# Patient Record
Sex: Female | Born: 1970 | Race: Black or African American | Marital: Married | Smoking: Former smoker
Health system: Southern US, Community
[De-identification: ages and names within clinical notes are randomized; demographics above are authoritative.]

## PROBLEM LIST (undated history)

## (undated) DIAGNOSIS — E559 Vitamin D deficiency, unspecified: Secondary | ICD-10-CM

## (undated) DIAGNOSIS — R7303 Prediabetes: Secondary | ICD-10-CM

## (undated) HISTORY — PX: TONSILLECTOMY: SUR1361

## (undated) HISTORY — DX: Vitamin D deficiency, unspecified: E55.9

## (undated) HISTORY — DX: Prediabetes: R73.03

---

## 2019-08-14 DIAGNOSIS — B353 Tinea pedis: Secondary | ICD-10-CM | POA: Insufficient documentation

## 2019-08-29 DIAGNOSIS — M79644 Pain in right finger(s): Secondary | ICD-10-CM | POA: Insufficient documentation

## 2019-10-21 DIAGNOSIS — R269 Unspecified abnormalities of gait and mobility: Secondary | ICD-10-CM | POA: Insufficient documentation

## 2019-10-21 DIAGNOSIS — G8929 Other chronic pain: Secondary | ICD-10-CM | POA: Insufficient documentation

## 2019-10-21 DIAGNOSIS — R1031 Right lower quadrant pain: Secondary | ICD-10-CM | POA: Insufficient documentation

## 2019-10-21 DIAGNOSIS — M792 Neuralgia and neuritis, unspecified: Secondary | ICD-10-CM | POA: Insufficient documentation

## 2020-03-04 ENCOUNTER — Other Ambulatory Visit: Payer: Self-pay | Admitting: Internal Medicine

## 2020-03-04 ENCOUNTER — Ambulatory Visit
Admission: RE | Admit: 2020-03-04 | Discharge: 2020-03-04 | Disposition: A | Discharge status: Home / Self Care | Payer: BLUE CROSS/BLUE SHIELD | Source: Ambulatory Visit | Attending: Internal Medicine | Admitting: Internal Medicine

## 2020-03-04 ENCOUNTER — Other Ambulatory Visit: Payer: Self-pay

## 2020-03-04 DIAGNOSIS — M25551 Pain in right hip: Secondary | ICD-10-CM

## 2020-03-04 DIAGNOSIS — R202 Paresthesia of skin: Secondary | ICD-10-CM | POA: Diagnosis not present

## 2020-03-04 DIAGNOSIS — M4186 Other forms of scoliosis, lumbar region: Secondary | ICD-10-CM | POA: Diagnosis not present

## 2020-03-04 DIAGNOSIS — M4316 Spondylolisthesis, lumbar region: Secondary | ICD-10-CM | POA: Diagnosis not present

## 2020-03-04 DIAGNOSIS — M5136 Other intervertebral disc degeneration, lumbar region: Secondary | ICD-10-CM | POA: Diagnosis not present

## 2020-03-04 DIAGNOSIS — E042 Nontoxic multinodular goiter: Secondary | ICD-10-CM | POA: Diagnosis not present

## 2020-03-04 DIAGNOSIS — R7303 Prediabetes: Secondary | ICD-10-CM | POA: Diagnosis not present

## 2020-03-04 DIAGNOSIS — R768 Other specified abnormal immunological findings in serum: Secondary | ICD-10-CM | POA: Diagnosis not present

## 2020-03-04 DIAGNOSIS — M1611 Unilateral primary osteoarthritis, right hip: Secondary | ICD-10-CM | POA: Diagnosis not present

## 2020-03-04 DIAGNOSIS — I878 Other specified disorders of veins: Secondary | ICD-10-CM | POA: Diagnosis not present

## 2020-03-19 ENCOUNTER — Other Ambulatory Visit: Payer: Self-pay | Admitting: Internal Medicine

## 2020-03-19 DIAGNOSIS — Z1231 Encounter for screening mammogram for malignant neoplasm of breast: Secondary | ICD-10-CM

## 2020-04-02 ENCOUNTER — Ambulatory Visit
Admission: RE | Admit: 2020-04-02 | Discharge: 2020-04-02 | Disposition: A | Discharge status: Home / Self Care | Payer: BLUE CROSS/BLUE SHIELD | Source: Ambulatory Visit | Attending: Internal Medicine | Admitting: Internal Medicine

## 2020-04-02 ENCOUNTER — Other Ambulatory Visit: Payer: Self-pay

## 2020-04-02 DIAGNOSIS — Z1231 Encounter for screening mammogram for malignant neoplasm of breast: Secondary | ICD-10-CM

## 2020-05-07 ENCOUNTER — Ambulatory Visit: Payer: BLUE CROSS/BLUE SHIELD | Admitting: Obstetrics and Gynecology

## 2021-07-18 DIAGNOSIS — R7303 Prediabetes: Secondary | ICD-10-CM | POA: Diagnosis not present

## 2021-07-18 DIAGNOSIS — Z0000 Encounter for general adult medical examination without abnormal findings: Secondary | ICD-10-CM | POA: Diagnosis not present

## 2021-07-18 DIAGNOSIS — E559 Vitamin D deficiency, unspecified: Secondary | ICD-10-CM | POA: Diagnosis not present

## 2021-07-26 ENCOUNTER — Other Ambulatory Visit: Payer: Self-pay | Admitting: Internal Medicine

## 2021-07-26 DIAGNOSIS — Z1231 Encounter for screening mammogram for malignant neoplasm of breast: Secondary | ICD-10-CM

## 2021-07-27 ENCOUNTER — Other Ambulatory Visit: Payer: Self-pay

## 2021-07-27 ENCOUNTER — Ambulatory Visit
Admission: RE | Admit: 2021-07-27 | Discharge: 2021-07-27 | Disposition: A | Discharge status: Home / Self Care | Payer: BC Managed Care – PPO | Source: Ambulatory Visit | Attending: Internal Medicine | Admitting: Internal Medicine

## 2021-07-27 DIAGNOSIS — Z1231 Encounter for screening mammogram for malignant neoplasm of breast: Secondary | ICD-10-CM | POA: Diagnosis not present

## 2021-08-23 ENCOUNTER — Ambulatory Visit
Admission: RE | Admit: 2021-08-23 | Discharge: 2021-08-23 | Disposition: A | Discharge status: Home / Self Care | Payer: BC Managed Care – PPO | Source: Ambulatory Visit | Attending: Internal Medicine | Admitting: Internal Medicine

## 2021-08-23 ENCOUNTER — Other Ambulatory Visit: Payer: Self-pay | Admitting: Internal Medicine

## 2021-08-23 DIAGNOSIS — M545 Low back pain, unspecified: Secondary | ICD-10-CM | POA: Diagnosis not present

## 2021-08-23 DIAGNOSIS — M25551 Pain in right hip: Secondary | ICD-10-CM | POA: Diagnosis not present

## 2021-08-23 DIAGNOSIS — E663 Overweight: Secondary | ICD-10-CM | POA: Diagnosis not present

## 2021-09-13 IMAGING — CR DG LUMBAR SPINE 2-3V
3 series · 3 of 3 positions shown · non-contrast
Comparison: None.

CLINICAL DATA: Right hip pain and right leg weakness.

EXAM:
LUMBAR SPINE - 2-3 VIEW

[w lumbar spine ap]
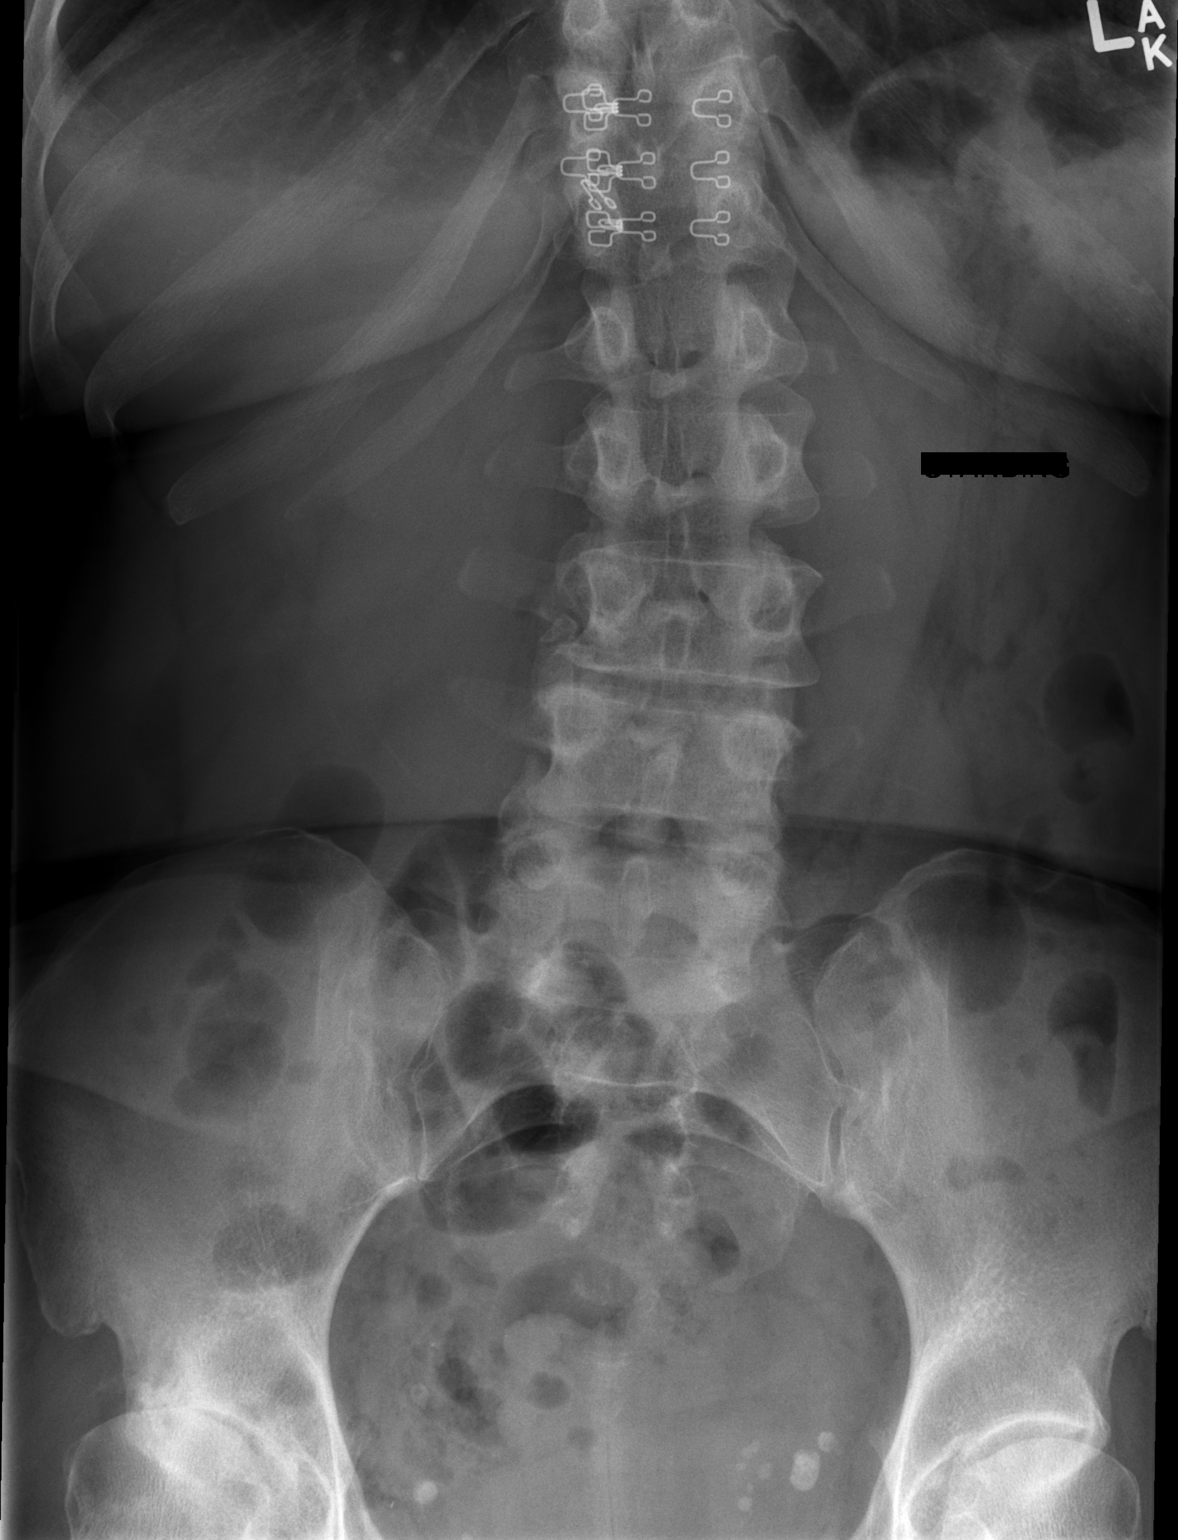

[w lumbar spine lat]
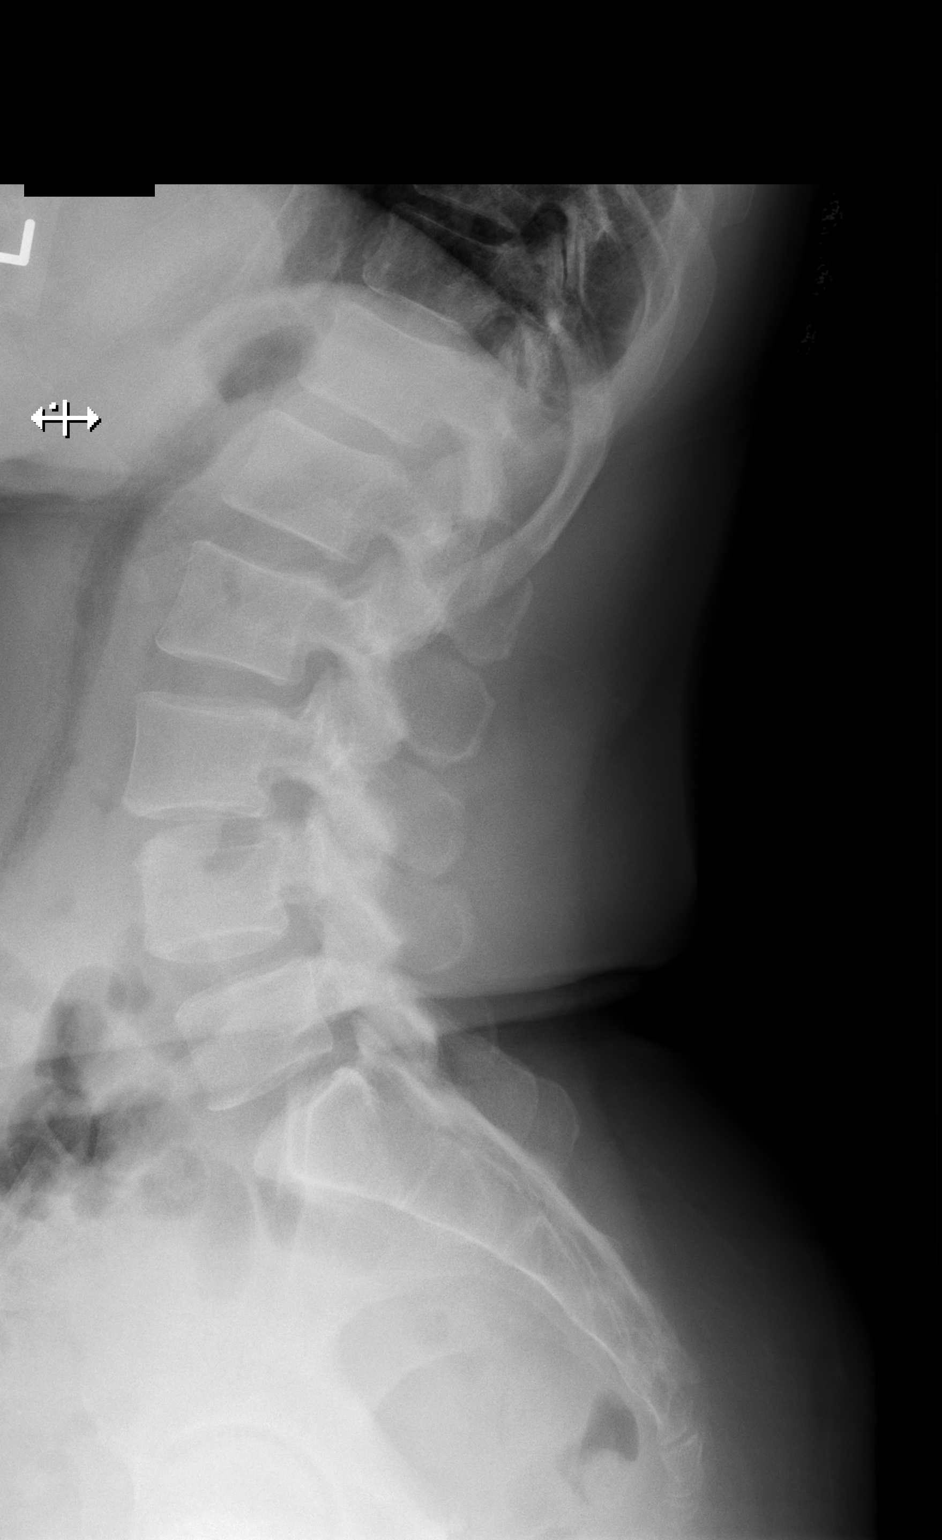

[w lumbar l-5 s-1 spot]
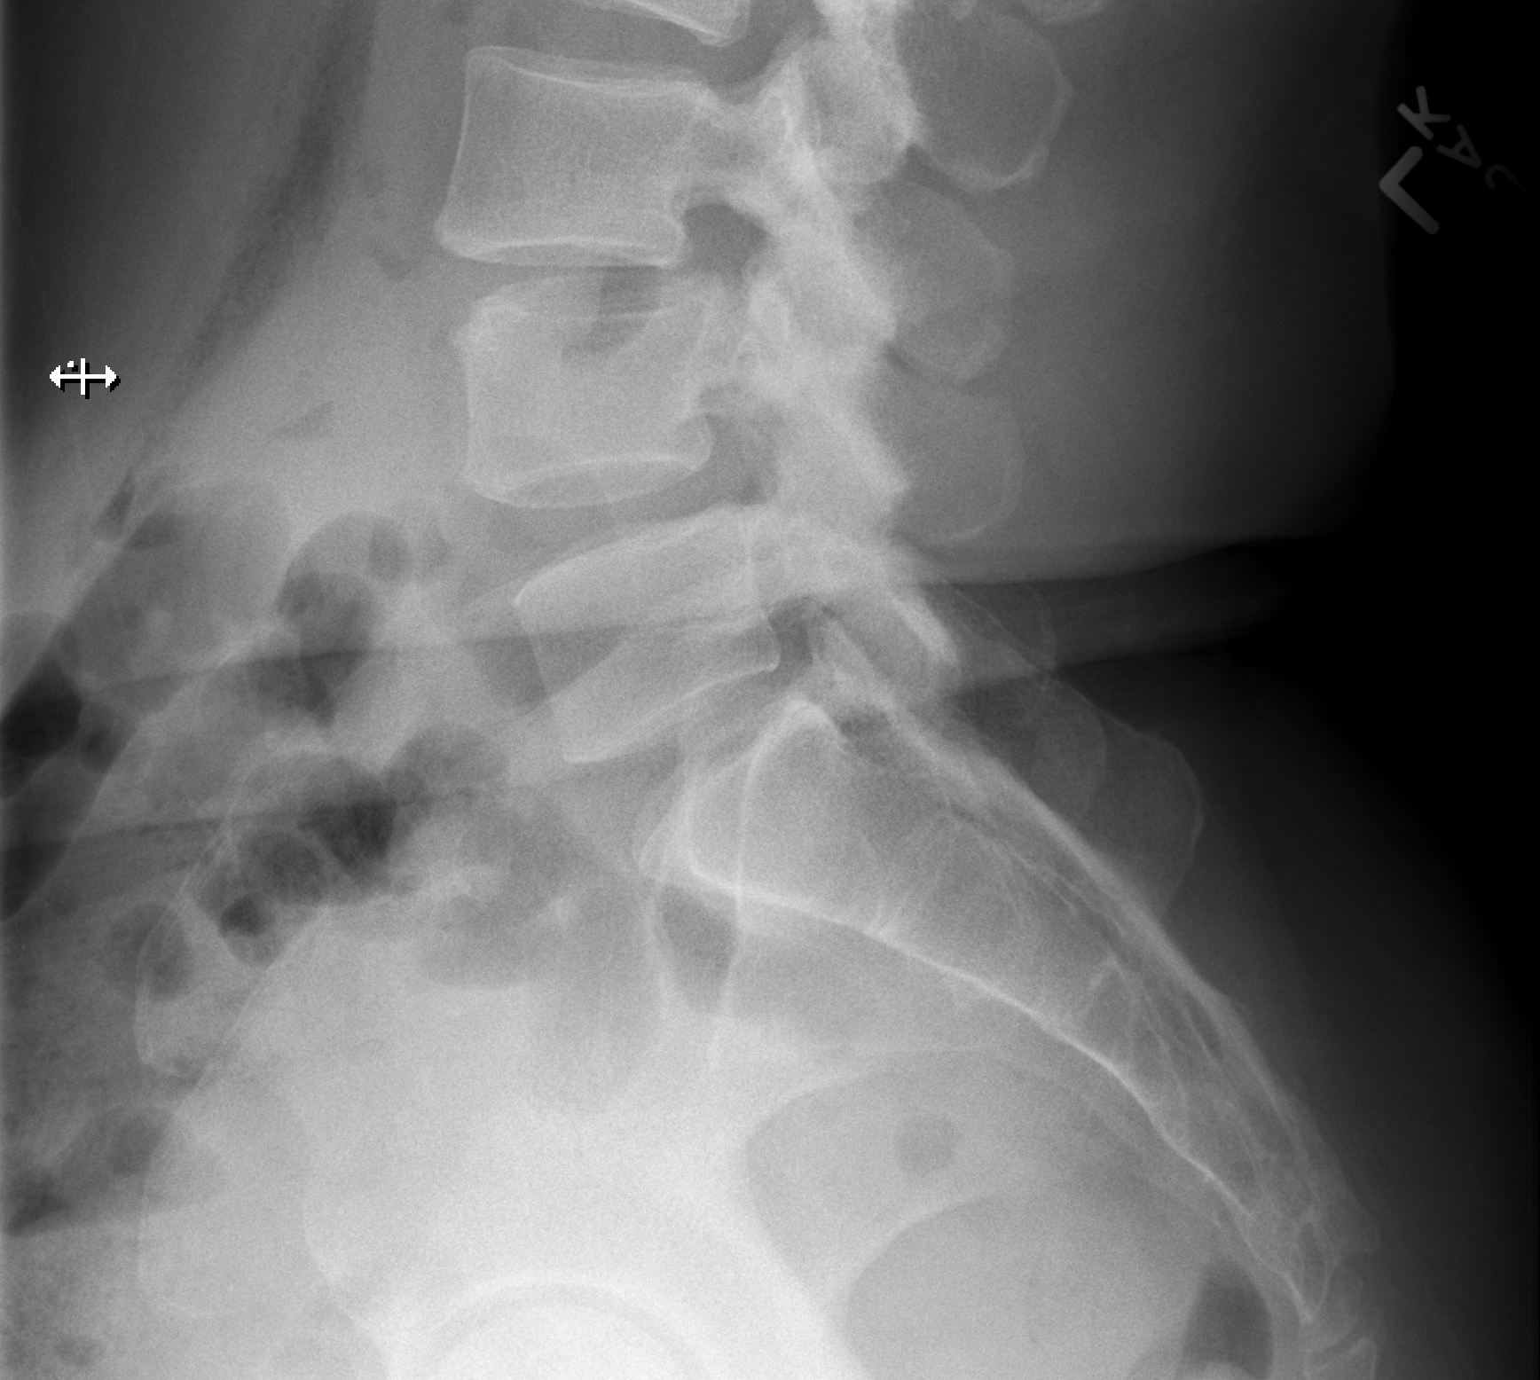

[3 of 3 positions shown; findings below may reference images not displayed]

FINDINGS: There is no evidence of lumbar spine fracture. There is mild
levoscoliosis. Approximately 2 mm anterolisthesis of the L3
vertebral body is seen on L4. Mild to moderate severity
intervertebral disc space narrowing is noted at the level of L3-L4.
IMPRESSION: 1. Mild levoscoliosis.
2. Mild to moderate degenerative disc disease at L3-L4.
3. Grade 1 anterolisthesis of the L3 vertebral body.

## 2021-10-24 DIAGNOSIS — M545 Low back pain, unspecified: Secondary | ICD-10-CM | POA: Diagnosis not present

## 2021-10-24 DIAGNOSIS — M25551 Pain in right hip: Secondary | ICD-10-CM | POA: Diagnosis not present

## 2021-10-27 ENCOUNTER — Encounter: Payer: Self-pay | Admitting: Family Medicine

## 2021-10-27 ENCOUNTER — Ambulatory Visit: Payer: BC Managed Care – PPO | Admitting: Family Medicine

## 2021-10-27 VITALS — BP 116/68 | HR 64 | Temp 97.8°F | Ht 64.0 in | Wt 157.0 lb

## 2021-10-27 DIAGNOSIS — Z01818 Encounter for other preprocedural examination: Secondary | ICD-10-CM

## 2021-10-27 NOTE — Patient Instructions (Signed)
Daisey Must with your surgery.  ?I will see you back as needed.  ?

## 2021-10-27 NOTE — Progress Notes (Signed)
? ?  Subjective:  ? ? Patient ID: Terri Parsons, female    DOB: 1971-07-21, 51 y.o.   MRN: [id number] ? ?HPI ?Chief Complaint  ?Patient presents with  ? Establish Care  ?  Orthopedic doctor (Dr. Edmonia Lynch at [facility]) needs surgical clearance   ? ?She is new to the practice and here for surgical clearance.  ?Previous medical care: ? ?Other providers: ?Orthopedist  ? ?States she is having surgery, Right total hip replacement. Date is TBD.  ?Dr. Percell Miller will be doing her surgery.  ?She has a form for me to sign off on today.  ? ?Denies hx of surgical complications. She had tonsillectomy in past.  ? ?History of prediabetes  ?Hgb A1c 5.2% in 08/2019 ? ?Denies hx of cardiac, pulmonary, thyroid, liver, or renal disease.  ? ?Denies fever, chills, unexplained weight loss, fatigue, dizziness, headache, vision change, chest pain, palpitations, shortness of breath, cough, orthopnea,  abdominal pain, N/V/D, urinary symptoms, LE edema.  ? ? ?Social history: Lives with spouse, no children, works as Retail buyer from 3p-11p. Also works at [employer] during the day.  ? ? ? ?Reviewed allergies, medications, past medical, surgical, family, and social history. ? ? ? ?Review of Systems ?Pertinent positives and negatives in the history of present illness. ? ?   ?Objective:  ? Physical Exam ?BP 116/68 (BP Location: Left Arm, Patient Position: Sitting, Cuff Size: Large)   Pulse 64   Temp 97.8 ?F (36.6 ?C) (Temporal)   Ht 5\' 4"  (1.626 m)   Wt 157 lb (71.2 kg)   LMP 10/12/2021   SpO2 98%   BMI 26.95 kg/m?  ? ?Alert and in no distress. Pharyngeal area is normal. Neck is supple without adenopathy or thyromegaly. Cardiac exam shows a regular rhythm without murmurs or gallops. Lungs are clear to auscultation. Extremities without edema. Skin is warm and dry. CNs intact. PERLLA. Normal mood and behavior.  ? ? ? ?   ?Assessment & Plan:  ?Preoperative clearance - Plan: EKG 12-Lead ? ?EKG read by myself and Dr. Sharlet Salina. No acute  changes. No old EKG for comparison.  ?She is in her usual state of health and no concerns today.  ?She would like surgical clearance to have a right total hip replacement by Dr. Percell Miller.  ?Labs will be done by orthopedist per pre-op form.  ?I see no reason based on my exam today for her to avoid surgery. I wish her a successful surgery and she will follow up for a fasting CPE.  ?Surgical clearance faxed along with EKG ?

## 2021-11-16 ENCOUNTER — Encounter: Payer: Self-pay | Admitting: Family Medicine

## 2021-11-16 ENCOUNTER — Ambulatory Visit (INDEPENDENT_AMBULATORY_CARE_PROVIDER_SITE_OTHER): Payer: BC Managed Care – PPO | Admitting: Family Medicine

## 2021-11-16 VITALS — BP 106/70 | HR 90 | Temp 98.0°F | Ht 64.0 in | Wt 153.8 lb

## 2021-11-16 DIAGNOSIS — Z1211 Encounter for screening for malignant neoplasm of colon: Secondary | ICD-10-CM | POA: Diagnosis not present

## 2021-11-16 DIAGNOSIS — Z0000 Encounter for general adult medical examination without abnormal findings: Secondary | ICD-10-CM

## 2021-11-16 DIAGNOSIS — Z87898 Personal history of other specified conditions: Secondary | ICD-10-CM | POA: Diagnosis not present

## 2021-11-16 DIAGNOSIS — Z23 Encounter for immunization: Secondary | ICD-10-CM | POA: Diagnosis not present

## 2021-11-16 DIAGNOSIS — Z114 Encounter for screening for human immunodeficiency virus [HIV]: Secondary | ICD-10-CM

## 2021-11-16 DIAGNOSIS — Z1329 Encounter for screening for other suspected endocrine disorder: Secondary | ICD-10-CM

## 2021-11-16 DIAGNOSIS — Z1159 Encounter for screening for other viral diseases: Secondary | ICD-10-CM

## 2021-11-16 DIAGNOSIS — E559 Vitamin D deficiency, unspecified: Secondary | ICD-10-CM | POA: Diagnosis not present

## 2021-11-16 NOTE — Progress Notes (Signed)
? ?Subjective:  ? ? Patient ID: Terri Parsons, female    DOB: March 17, 1971, 51 y.o.   MRN: [id number] ? ?HPI ?Chief Complaint  ?Patient presents with  ? Annual Exam  ? ?She is fairly new to the practice and here for a complete physical exam. ? ? ?Other providers: ?Orthopedic- Dr. Eulah Pont  ? ?Hx of prediabetes and vitamin D def ? 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 ? 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  ? ?Immunizations: Tdap overdue and Shingles ? ?Health maintenance:   ?Mammogram: 06/2021 ?Colonoscopy: never  ?Last Gynecological Exam: 2 years ago and HPV + per patient  ? ? ? ?Reviewed allergies, medications, past medical, surgical, family, and social history. ? ? ? ?Review of Systems ?Review of Systems ?Constitutional: -fever, -chills, -sweats, -unexpected weight change,-fatigue ?ENT: -runny nose, -ear pain, -sore throat ?Cardiology:  -chest pain, -palpitations, -edema ?Respiratory: -cough, -shortness of breath, -wheezing ?Gastroenterology: -abdominal pain, -nausea, -vomiting, -diarrhea, -constipation  ?Hematology: -bleeding or bruising problems ?Musculoskeletal: -arthralgias, -myalgias, -joint swelling, -back pain ?Ophthalmology: -vision changes ?Urology: -dysuria, -difficulty urinating, -hematuria, -urinary frequency, -urgency ?Neurology: -headache, -weakness, -tingling, -numbness ?  ? ?   ?Objective:  ? Physical Exam ?BP 106/70 (BP Location: Left Arm, Patient Position: Sitting, Cuff Size: Large)   Pulse 90   Temp 98 ?F (36.7 ?C) (Temporal)   Ht 5\' 4"  (1.626 m)   Wt 153 lb 12.8 oz (69.8 kg)   LMP 10/27/2021   SpO2 98%   BMI 26.40 kg/m?  ? ?General Appearance:    Alert, cooperative, no distress, appears stated age  ?Head:    Normocephalic, without obvious abnormality, atraumatic  ?Eyes:    PERRL, conjunctiva/corneas clear, EOM's intact  ?Ears:    Normal TM's and external  ear canals  ?Nose: Not done  ?Throat: Not done  ?Neck:   Supple, no lymphadenopathy;  thyroid:  no   enlargement/tenderness/nodules; no JVD  ?Back:    Spine nontender, no curvature, ROM normal, no CVA     tenderness  ?Lungs:     Clear to auscultation bilaterally without wheezes, rales or     ronchi; respirations unlabored  ?Chest Wall:    No tenderness or deformity  ? Heart:    Regular rate and rhythm, S1 and S2 normal, no murmur, rub ?  or gallop  ?Breast Exam:  OB/GYN  ?Abdomen:     Soft, non-tender, nondistended, normoactive bowel sounds,  ?  no masses, no hepatosplenomegaly  ?Genitalia:  OB/GYN  ?   ?Extremities:   No clubbing, cyanosis or edema  ?Pulses:   2+ and symmetric all extremities  ?Skin:   Skin color, texture, turgor normal  ?Lymph nodes:   Cervical, supraclavicular, and axillary nodes normal  ?Neurologic:   CNII-XII intact, normal strength, sensation and gait; reflexes 2+ and symmetric throughout  ?        Psych:   Normal mood, affect, hygiene and grooming.   ? ? ? ?   ?Assessment & Plan:  ?Routine general medical examination at a health care facility - Plan: CBC with Differential/Platelet, Comprehensive metabolic panel, Lipid panel, TSH, T4, free ?-Preventive health care reviewed.  She will call and schedule with an OB/GYN.  Mammogram will be due again in December and she will call the breast center for this.  Counseling on healthy lifestyle including diet and exercise.  Immunizations reviewed and updated.  Recommend regular dental and eye exams. ? ?Vitamin D deficiency - Plan: VITAMIN D 25 Hydroxy (Vit-D Deficiency, Fractures) ?-Follow-up pending vitamin D level ? ?Screen for colon cancer - Plan: Ambulatory referral to Gastroenterology ?-Referred to GI for her for screening colonoscopy. ? ?History of prediabetes - Plan: Hemoglobin A1c ?-Recommend low sugar, low-carb diet ? ?Need for hepatitis C screening test - Plan: Hepatitis C antibody ?-Done per screening guidelines ? ?Need for  diphtheria-tetanus-pertussis (Tdap) vaccine - Plan: Tdap vaccine greater than or equal to 7yo IM ?-Discussed potential side effects ? ?Screening for HIV without presence of risk factors - Plan: HIV Antibody (routine testing w rflx) ?-Done per screening guidelines ? ?Screening for thyroid disorder ? ? ?

## 2021-11-16 NOTE — Patient Instructions (Addendum)
You are eligible to get the shingles vaccine since you are over 51 years old. This is a 2 shot series. Check with your insurance regarding your financial responsibility with this.  ? ? ?Call and schedule with a gynecologist.  ? ?Obgyn Offices:  ? ?[facility] ?[address] ?[address] ?[place], [place] ?[phone] ? ?[facility] ?Address: [address] ?[place], [place] [address] ?Phone: [phone] ? ?[facility] ?[address] ?[address] ?[place], [place] [address] ?Phone: [phone] ? ?[facility] ?[address] ?[place], [place] [address] ?Phone: [phone] ? ?Preventive Care 5-65 Years Old, Female ?Preventive care refers to lifestyle choices and visits with your health care provider that can promote health and wellness. Preventive care visits are also called wellness exams. ?What can I expect for my preventive care visit? ?Counseling ?Your health care provider may ask you questions about your: ?Medical history, including: ?Past medical problems. ?Family medical history. ?Pregnancy history. ?Current health, including: ?Menstrual cycle. ?Method of birth control. ?Emotional well-being. ?Home life and relationship well-being. ?Sexual activity and sexual health. ?Lifestyle, including: ?Alcohol, nicotine or tobacco, and drug use. ?Access to firearms. ?Diet, exercise, and sleep habits. ?Work and work Statistician. ?Sunscreen use. ?Safety issues such as seatbelt and bike helmet use. ?Physical exam ?Your health care provider will check your: ?Height and weight. These may be used to calculate your BMI (body mass index). BMI is a measurement that tells if you are at a healthy weight. ?Waist circumference. This measures the distance around your waistline. This measurement also tells if you are at a healthy weight and may help predict your risk of certain diseases, such as type 2 diabetes and high blood pressure. ?Heart rate and blood  pressure. ?Body temperature. ?Skin for abnormal spots. ?What immunizations do I need? ? ?Vaccines are usually given at various ages, according to a schedule. Your health care provider will recommend vaccines for you based on your age, medical history, and lifestyle or other factors, such as travel or where you work. ?What tests do I need? ?Screening ?Your health care provider may recommend screening tests for certain conditions. This may include: ?Lipid and cholesterol levels. ?Diabetes screening. This is done by checking your blood sugar (glucose) after you have not eaten for a while (fasting). ?Pelvic exam and Pap test. ?Hepatitis B test. ?Hepatitis C test. ?HIV (human immunodeficiency virus) test. ?STI (sexually transmitted infection) testing, if you are at risk. ?Lung cancer screening. ?Colorectal cancer screening. ?Mammogram. Talk with your health care provider about when you should start having regular mammograms. This may depend on whether you have a family history of breast cancer. ?BRCA-related cancer screening. This may be done if you have a family history of breast, ovarian, tubal, or peritoneal cancers. ?Bone density scan. This is done to screen for osteoporosis. ?Talk with your health care provider about your test results, treatment options, and if necessary, the need for more tests. ?Follow these instructions at home: ?Eating and drinking ? ?Eat a diet that includes fresh fruits and vegetables, whole grains, lean protein, and low-fat dairy products. ?Take vitamin and mineral supplements as recommended by your health care provider. ?Do not drink alcohol if: ?Your health care provider tells you not to drink. ?You are pregnant, may be pregnant, or are planning to become pregnant. ?If you drink alcohol: ?Limit how much you have to 0-1 drink a day. ?Know how much alcohol is in your drink. In the [place] one drink equals one 12  oz bottle of beer (355 mL), one 5 oz glass of wine (148 mL), or one 1? oz glass of  hard liquor (44 mL). ?Lifestyle ?Brush your teeth every morning and night with fluoride toothpaste. Floss one time each day. ?Exercise for at least 30 minutes 5 or more days each week. ?Do not use any products that contain nicotine or tobacco. These products include cigarettes, chewing tobacco, and vaping devices, such as e-cigarettes. If you need help quitting, ask your health care provider. ?Do not use drugs. ?If you are sexually active, practice safe sex. Use a condom or other form of protection to prevent STIs. ?If you do not wish to become pregnant, use a form of birth control. If you plan to become pregnant, see your health care provider for a prepregnancy visit. ?Take aspirin only as told by your health care provider. Make sure that you understand how much to take and what form to take. Work with your health care provider to find out whether it is safe and beneficial for you to take aspirin daily. ?Find healthy ways to manage stress, such as: ?Meditation, yoga, or listening to music. ?Journaling. ?Talking to a trusted person. ?Spending time with friends and family. ?Minimize exposure to UV radiation to reduce your risk of skin cancer. ?Safety ?Always wear your seat belt while driving or riding in a vehicle. ?Do not drive: ?If you have been drinking alcohol. Do not ride with someone who has been drinking. ?When you are tired or distracted. ?While texting. ?If you have been using any mind-altering substances or drugs. ?Wear a helmet and other protective equipment during sports activities. ?If you have firearms in your house, make sure you follow all gun safety procedures. ?Seek help if you have been physically or sexually abused. ?What's next? ?Visit your health care provider once a year for an annual wellness visit. ?Ask your health care provider how often you should have your eyes and teeth checked. ?Stay up to date on all vaccines. ?This information is not intended to replace advice given to you by your  health care provider. Make sure you discuss any questions you have with your health care provider. ?Document Revised: 01/12/2021 Document Reviewed: 01/12/2021 ?Elsevier Patient Education ? [place]. ?  ?

## 2021-11-17 ENCOUNTER — Other Ambulatory Visit (INDEPENDENT_AMBULATORY_CARE_PROVIDER_SITE_OTHER): Payer: BC Managed Care – PPO

## 2021-11-17 DIAGNOSIS — Z8639 Personal history of other endocrine, nutritional and metabolic disease: Secondary | ICD-10-CM

## 2021-11-17 DIAGNOSIS — Z114 Encounter for screening for human immunodeficiency virus [HIV]: Secondary | ICD-10-CM | POA: Diagnosis not present

## 2021-11-17 DIAGNOSIS — E559 Vitamin D deficiency, unspecified: Secondary | ICD-10-CM | POA: Diagnosis not present

## 2021-11-17 DIAGNOSIS — Z1159 Encounter for screening for other viral diseases: Secondary | ICD-10-CM | POA: Diagnosis not present

## 2021-11-17 DIAGNOSIS — Z0000 Encounter for general adult medical examination without abnormal findings: Secondary | ICD-10-CM

## 2021-11-17 DIAGNOSIS — Z87898 Personal history of other specified conditions: Secondary | ICD-10-CM

## 2021-11-17 LAB — LIPID PANEL
Cholesterol: 179 mg/dL (ref 0–200)
HDL: 89.3 mg/dL (ref >39.00)
LDL Cholesterol: 81 mg/dL (ref 0–99)
NonHDL: 89.78
Total CHOL/HDL Ratio: 2
Triglycerides: 43 mg/dL (ref 0.0–149.0)
VLDL: 8.6 mg/dL (ref 0.0–40.0)

## 2021-11-17 LAB — HEMOGLOBIN A1C: Hgb A1c MFr Bld: 5.6 % (ref 4.6–6.5)

## 2021-11-17 LAB — VITAMIN D 25 HYDROXY (VIT D DEFICIENCY, FRACTURES): VITD: 67.68 ng/mL (ref 30.00–100.00)

## 2021-11-17 LAB — TSH: TSH: 1.46 u[IU]/mL (ref 0.35–5.50)

## 2021-11-17 LAB — T4, FREE: Free T4: 0.91 ng/dL (ref 0.60–1.60)

## 2021-11-18 LAB — HEPATITIS C ANTIBODY
Hepatitis C Ab: NONREACTIVE
SIGNAL TO CUT-OFF: 0.13 (ref <1.00)

## 2021-11-18 LAB — HIV ANTIBODY (ROUTINE TESTING W REFLEX): HIV 1&2 Ab, 4th Generation: NONREACTIVE

## 2021-11-23 ENCOUNTER — Other Ambulatory Visit (INDEPENDENT_AMBULATORY_CARE_PROVIDER_SITE_OTHER): Payer: BC Managed Care – PPO

## 2021-11-23 ENCOUNTER — Other Ambulatory Visit: Payer: Self-pay | Admitting: Family Medicine

## 2021-11-23 DIAGNOSIS — Z0000 Encounter for general adult medical examination without abnormal findings: Secondary | ICD-10-CM | POA: Diagnosis not present

## 2021-11-23 LAB — COMPREHENSIVE METABOLIC PANEL
ALT: 22 U/L (ref 0–35)
AST: 29 U/L (ref 0–37)
Albumin: 4.2 g/dL (ref 3.5–5.2)
Alkaline Phosphatase: 54 U/L (ref 39–117)
BUN: 11 mg/dL (ref 6–23)
CO2: 29 mEq/L (ref 19–32)
Calcium: 9.1 mg/dL (ref 8.4–10.5)
Chloride: 102 mEq/L (ref 96–112)
Creatinine, Ser: 0.74 mg/dL (ref 0.40–1.20)
GFR: 93.78 mL/min (ref >60.00)
Glucose, Bld: 71 mg/dL (ref 70–99)
Potassium: 3.5 mEq/L (ref 3.5–5.1)
Sodium: 138 mEq/L (ref 135–145)
Total Bilirubin: 0.6 mg/dL (ref 0.2–1.2)
Total Protein: 6.6 g/dL (ref 6.0–8.3)

## 2021-11-23 LAB — CBC WITH DIFFERENTIAL/PLATELET
Basophils Absolute: 0 10*3/uL (ref 0.0–0.1)
Basophils Relative: 0.9 % (ref 0.0–3.0)
Eosinophils Absolute: 0.1 10*3/uL (ref 0.0–0.7)
Eosinophils Relative: 3 % (ref 0.0–5.0)
HCT: 37.4 % (ref 36.0–46.0)
Hemoglobin: 12.5 g/dL (ref 12.0–15.0)
Lymphocytes Relative: 36.6 % (ref 12.0–46.0)
Lymphs Abs: 1.5 10*3/uL (ref 0.7–4.0)
MCHC: 33.4 g/dL (ref 30.0–36.0)
MCV: 93.1 fl (ref 78.0–100.0)
Monocytes Absolute: 0.4 10*3/uL (ref 0.1–1.0)
Monocytes Relative: 9.6 % (ref 3.0–12.0)
Neutro Abs: 2 10*3/uL (ref 1.4–7.7)
Neutrophils Relative %: 49.9 % (ref 43.0–77.0)
Platelets: 288 10*3/uL (ref 150.0–400.0)
RBC: 4.02 Mil/uL (ref 3.87–5.11)
RDW: 13.3 % (ref 11.5–15.5)
WBC: 4.1 10*3/uL (ref 4.0–10.5)

## 2021-11-23 NOTE — Addendum Note (Signed)
Addended by: Marinus Maw on: 11/23/2021 10:37 AM ? ? Modules accepted: Orders ? ?

## 2021-11-28 HISTORY — PX: TOTAL HIP ARTHROPLASTY: SHX124

## 2021-12-02 DIAGNOSIS — M25551 Pain in right hip: Secondary | ICD-10-CM | POA: Diagnosis not present

## 2021-12-02 DIAGNOSIS — Z01812 Encounter for preprocedural laboratory examination: Secondary | ICD-10-CM | POA: Diagnosis not present

## 2021-12-13 DIAGNOSIS — M1611 Unilateral primary osteoarthritis, right hip: Secondary | ICD-10-CM | POA: Diagnosis not present

## 2021-12-19 DIAGNOSIS — M1611 Unilateral primary osteoarthritis, right hip: Secondary | ICD-10-CM | POA: Diagnosis not present

## 2021-12-28 DIAGNOSIS — M1611 Unilateral primary osteoarthritis, right hip: Secondary | ICD-10-CM | POA: Diagnosis not present

## 2022-01-13 ENCOUNTER — Encounter: Payer: Self-pay | Admitting: Family Medicine

## 2022-01-25 DIAGNOSIS — M25531 Pain in right wrist: Secondary | ICD-10-CM | POA: Diagnosis not present

## 2022-01-30 ENCOUNTER — Encounter: Payer: Self-pay | Admitting: Gastroenterology

## 2022-02-14 ENCOUNTER — Ambulatory Visit: Payer: BC Managed Care – PPO | Admitting: Internal Medicine

## 2022-03-10 ENCOUNTER — Encounter: Payer: Self-pay | Admitting: Internal Medicine

## 2022-03-22 ENCOUNTER — Ambulatory Visit: Payer: BC Managed Care – PPO | Admitting: Gastroenterology

## 2022-03-27 ENCOUNTER — Ambulatory Visit (AMBULATORY_SURGERY_CENTER): Payer: BC Managed Care – PPO

## 2022-03-27 VITALS — Ht 64.0 in | Wt 155.0 lb

## 2022-03-27 DIAGNOSIS — Z1211 Encounter for screening for malignant neoplasm of colon: Secondary | ICD-10-CM

## 2022-03-27 MED ORDER — NA SULFATE-K SULFATE-MG SULF 17.5-3.13-1.6 GM/177ML PO SOLN: 1.0000 | ORAL | 0 refills | Status: DC

## 2022-03-27 NOTE — Progress Notes (Signed)
No egg or soy allergy known to patient  No issues known to pt with past sedation with any surgeries or procedures Patient denies ever being told they had issues or difficulty with intubation  No FH of Malignant Hyperthermia Pt is not on diet pills Pt is not on  home 02  Pt is not on blood thinners  Pt denies issues with constipation  No A fib or A flutter Have any cardiac testing pending--denied Pt instructed to use Singlecare.com or GoodRx for a price reduction on prep   

## 2022-04-05 DIAGNOSIS — Z01419 Encounter for gynecological examination (general) (routine) without abnormal findings: Secondary | ICD-10-CM | POA: Diagnosis not present

## 2022-04-05 DIAGNOSIS — Z130 Encounter for screening for diseases of the blood and blood-forming organs and certain disorders involving the immune mechanism: Secondary | ICD-10-CM | POA: Diagnosis not present

## 2022-04-05 DIAGNOSIS — Z124 Encounter for screening for malignant neoplasm of cervix: Secondary | ICD-10-CM | POA: Diagnosis not present

## 2022-04-05 DIAGNOSIS — Z1151 Encounter for screening for human papillomavirus (HPV): Secondary | ICD-10-CM | POA: Diagnosis not present

## 2022-04-05 DIAGNOSIS — Z202 Contact with and (suspected) exposure to infections with a predominantly sexual mode of transmission: Secondary | ICD-10-CM | POA: Diagnosis not present

## 2022-04-24 ENCOUNTER — Encounter: Payer: BC Managed Care – PPO | Admitting: Internal Medicine

## 2022-06-01 DIAGNOSIS — M542 Cervicalgia: Secondary | ICD-10-CM | POA: Diagnosis not present

## 2022-06-01 DIAGNOSIS — M79641 Pain in right hand: Secondary | ICD-10-CM | POA: Diagnosis not present

## 2022-06-01 DIAGNOSIS — M5412 Radiculopathy, cervical region: Secondary | ICD-10-CM | POA: Diagnosis not present

## 2022-06-01 DIAGNOSIS — M9901 Segmental and somatic dysfunction of cervical region: Secondary | ICD-10-CM | POA: Diagnosis not present

## 2022-06-07 DIAGNOSIS — M9901 Segmental and somatic dysfunction of cervical region: Secondary | ICD-10-CM | POA: Diagnosis not present

## 2022-06-07 DIAGNOSIS — M5032 Other cervical disc degeneration, mid-cervical region, unspecified level: Secondary | ICD-10-CM | POA: Diagnosis not present

## 2022-06-07 DIAGNOSIS — M9905 Segmental and somatic dysfunction of pelvic region: Secondary | ICD-10-CM | POA: Diagnosis not present

## 2022-06-07 DIAGNOSIS — M9903 Segmental and somatic dysfunction of lumbar region: Secondary | ICD-10-CM | POA: Diagnosis not present

## 2022-06-13 DIAGNOSIS — M9901 Segmental and somatic dysfunction of cervical region: Secondary | ICD-10-CM | POA: Diagnosis not present

## 2022-06-13 DIAGNOSIS — M9905 Segmental and somatic dysfunction of pelvic region: Secondary | ICD-10-CM | POA: Diagnosis not present

## 2022-06-13 DIAGNOSIS — M9903 Segmental and somatic dysfunction of lumbar region: Secondary | ICD-10-CM | POA: Diagnosis not present

## 2022-06-13 DIAGNOSIS — M5032 Other cervical disc degeneration, mid-cervical region, unspecified level: Secondary | ICD-10-CM | POA: Diagnosis not present

## 2022-06-14 DIAGNOSIS — M9903 Segmental and somatic dysfunction of lumbar region: Secondary | ICD-10-CM | POA: Diagnosis not present

## 2022-06-14 DIAGNOSIS — M5032 Other cervical disc degeneration, mid-cervical region, unspecified level: Secondary | ICD-10-CM | POA: Diagnosis not present

## 2022-06-14 DIAGNOSIS — M9901 Segmental and somatic dysfunction of cervical region: Secondary | ICD-10-CM | POA: Diagnosis not present

## 2022-06-14 DIAGNOSIS — M9905 Segmental and somatic dysfunction of pelvic region: Secondary | ICD-10-CM | POA: Diagnosis not present

## 2022-06-19 DIAGNOSIS — M9901 Segmental and somatic dysfunction of cervical region: Secondary | ICD-10-CM | POA: Diagnosis not present

## 2022-06-19 DIAGNOSIS — M9903 Segmental and somatic dysfunction of lumbar region: Secondary | ICD-10-CM | POA: Diagnosis not present

## 2022-06-19 DIAGNOSIS — M9905 Segmental and somatic dysfunction of pelvic region: Secondary | ICD-10-CM | POA: Diagnosis not present

## 2022-06-19 DIAGNOSIS — M5032 Other cervical disc degeneration, mid-cervical region, unspecified level: Secondary | ICD-10-CM | POA: Diagnosis not present

## 2022-06-21 DIAGNOSIS — M9902 Segmental and somatic dysfunction of thoracic region: Secondary | ICD-10-CM | POA: Diagnosis not present

## 2022-06-21 DIAGNOSIS — M9901 Segmental and somatic dysfunction of cervical region: Secondary | ICD-10-CM | POA: Diagnosis not present

## 2022-06-21 DIAGNOSIS — M722 Plantar fascial fibromatosis: Secondary | ICD-10-CM | POA: Diagnosis not present

## 2022-06-21 DIAGNOSIS — M9903 Segmental and somatic dysfunction of lumbar region: Secondary | ICD-10-CM | POA: Diagnosis not present

## 2022-06-21 DIAGNOSIS — M25579 Pain in unspecified ankle and joints of unspecified foot: Secondary | ICD-10-CM | POA: Diagnosis not present

## 2022-06-21 DIAGNOSIS — M2140 Flat foot [pes planus] (acquired), unspecified foot: Secondary | ICD-10-CM | POA: Diagnosis not present

## 2022-06-21 DIAGNOSIS — M9905 Segmental and somatic dysfunction of pelvic region: Secondary | ICD-10-CM | POA: Diagnosis not present

## 2022-06-26 DIAGNOSIS — M9903 Segmental and somatic dysfunction of lumbar region: Secondary | ICD-10-CM | POA: Diagnosis not present

## 2022-06-26 DIAGNOSIS — M9901 Segmental and somatic dysfunction of cervical region: Secondary | ICD-10-CM | POA: Diagnosis not present

## 2022-06-26 DIAGNOSIS — M9902 Segmental and somatic dysfunction of thoracic region: Secondary | ICD-10-CM | POA: Diagnosis not present

## 2022-06-26 DIAGNOSIS — M9905 Segmental and somatic dysfunction of pelvic region: Secondary | ICD-10-CM | POA: Diagnosis not present

## 2022-06-28 DIAGNOSIS — M9905 Segmental and somatic dysfunction of pelvic region: Secondary | ICD-10-CM | POA: Diagnosis not present

## 2022-06-28 DIAGNOSIS — M9903 Segmental and somatic dysfunction of lumbar region: Secondary | ICD-10-CM | POA: Diagnosis not present

## 2022-06-28 DIAGNOSIS — M9901 Segmental and somatic dysfunction of cervical region: Secondary | ICD-10-CM | POA: Diagnosis not present

## 2022-06-28 DIAGNOSIS — M9902 Segmental and somatic dysfunction of thoracic region: Secondary | ICD-10-CM | POA: Diagnosis not present

## 2022-06-30 ENCOUNTER — Ambulatory Visit
Admission: EM | Admit: 2022-06-30 | Discharge: 2022-06-30 | Disposition: A | Discharge status: Home / Self Care | Payer: BC Managed Care – PPO | Attending: Internal Medicine | Admitting: Internal Medicine

## 2022-06-30 ENCOUNTER — Encounter: Payer: Self-pay | Admitting: Emergency Medicine

## 2022-06-30 DIAGNOSIS — R197 Diarrhea, unspecified: Secondary | ICD-10-CM

## 2022-06-30 DIAGNOSIS — B349 Viral infection, unspecified: Secondary | ICD-10-CM | POA: Diagnosis not present

## 2022-06-30 DIAGNOSIS — R112 Nausea with vomiting, unspecified: Secondary | ICD-10-CM | POA: Diagnosis not present

## 2022-06-30 MED ORDER — BENZONATATE 100 MG PO CAPS: 100.0000 mg | ORAL_CAPSULE | Freq: Three times a day (TID) | ORAL | 0 refills | Status: DC | PRN

## 2022-06-30 MED ORDER — FLUTICASONE PROPIONATE 50 MCG/ACT NA SUSP: 1.0000 | Freq: Every day | NASAL | 0 refills | Status: DC

## 2022-06-30 MED ORDER — ONDANSETRON 4 MG PO TBDP: 4.0000 mg | ORAL_TABLET | Freq: Three times a day (TID) | ORAL | 0 refills | Status: DC | PRN

## 2022-06-30 NOTE — ED Triage Notes (Signed)
Pt is present today with diarrhea, vomiting, abdominal pain, vomiting, cough, and nasal congestion x3 days ago.

## 2022-06-30 NOTE — ED Provider Notes (Signed)
[facility]    CSN: [id number] Arrival date & time: 06/30/22  1025      History   Chief Complaint Chief Complaint  Patient presents with   Abdominal Pain   Diarrhea   Dizziness    HPI Vania Rosero is a 51 y.o. female.   Patient presents with nausea, vomiting, cough, nasal congestion that started 3 days ago.  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  Denies any known fevers at home.  Patient reports that she has abdominal discomfort that feels like "somebody punched her in the stomach".  Denies blood in stool or emesis.  Patient reports that she has vomiting and diarrhea approximately every 6 hours and has not been able to eat any food but has been drinking fluids.  Denies chest pain, shortness of breath, sore throat, ear pain.  She has not taken any medications to alleviate symptoms.  Denies any formal diagnosis of asthma or COPD.   Abdominal Pain Diarrhea Dizziness   Past Medical History:  Diagnosis Date   Prediabetes    Vitamin D deficiency     There are no problems to display for this patient.   Past Surgical History:  Procedure Laterality Date   TONSILLECTOMY     TOTAL HIP ARTHROPLASTY Right 11/2021    OB History   No obstetric history on file.      Home Medications    Prior to Admission medications   Medication Sig Start Date End Date Taking? Authorizing Provider  benzonatate (TESSALON) 100 MG capsule Take 1 capsule (100 mg total) by mouth every 8 (eight) hours as needed for cough. 06/30/22  Yes Hue Frick, Hildred Alamin E, FNP  fluticasone (FLONASE) 50 MCG/ACT nasal spray Place 1 spray into both nostrils daily. 06/30/22  Yes Abdulmalik Darco, Hildred Alamin E, FNP  ondansetron (ZOFRAN-ODT) 4 MG disintegrating tablet Take 1 tablet (4 mg total) by mouth every 8 (eight) hours as needed for nausea or vomiting. 06/30/22  Yes Ariellah Faust, Hildred Alamin E, FNP  MULTIPLE VITAMIN-FOLIC ACID PO daily.    [provider]  Na Sulfate-K  Sulfate-Mg Sulf 17.5-3.13-1.6 GM/177ML SOLN Take 1 kit by mouth as directed. May use generic Suprep, no prior authorization. Take as directed. 03/27/22   Sharyn Creamer, MD    Family History Family History  Problem Relation Age of Onset   Renal Disease Mother    High blood pressure Mother    Early death Mother    Heart attack Father    Heart disease Father    Suicidality Sister    Suicidality Brother    Cancer Maternal Grandmother    Heart attack Paternal Grandmother    Heart disease Paternal Grandmother    Breast cancer Neg Hx    Colon cancer Neg Hx    Colon polyps Neg Hx    Esophageal cancer Neg Hx    Rectal cancer Neg Hx    Stomach cancer Neg Hx     Social History Social History   Tobacco Use   Smoking status: Former    Packs/day: 0.50    Years: 20.00    Total pack years: 10.00    Types: Cigarettes   Smokeless tobacco: Never  Substance Use Topics   Alcohol use: Yes    Comment: weekend   Drug use: Never     Allergies   Sulfa antibiotics   Review of Systems Review of Systems Per HPI  Physical Exam Triage Vital Signs ED Triage Vitals  Enc  Vitals Group     BP 06/30/22 1054 106/73     Pulse Rate 06/30/22 1054 (!) 101     Resp 06/30/22 1054 18     Temp 06/30/22 1054 99.1 F (37.3 C)     Temp src --      SpO2 06/30/22 1054 97 %     Weight --      Height --      Head Circumference --      Peak Flow --      Pain Score 06/30/22 1053 8     Pain Loc --      Pain Edu? --      Excl. in [place]? --    No data found.  Updated Vital Signs BP 106/73   Pulse (!) 101   Temp 99.1 F (37.3 C)   Resp 18   SpO2 97%   Visual Acuity Right Eye Distance:   Left Eye Distance:   Bilateral Distance:    Right Eye Near:   Left Eye Near:    Bilateral Near:     Physical Exam Constitutional:      General: She is not in acute distress.    Appearance: Normal appearance. She is not toxic-appearing or diaphoretic.  HENT:     Head: Normocephalic and atraumatic.      Right Ear: Tympanic membrane and ear canal normal.     Left Ear: Tympanic membrane and ear canal normal.     Nose: Congestion present.     Mouth/Throat:     Mouth: Mucous membranes are moist.     Pharynx: No posterior oropharyngeal erythema.  Eyes:     Extraocular Movements: Extraocular movements intact.     Conjunctiva/sclera: Conjunctivae normal.     Pupils: Pupils are equal, round, and reactive to light.  Cardiovascular:     Rate and Rhythm: Normal rate and regular rhythm.     Pulses: Normal pulses.     Heart sounds: Normal heart sounds.  Pulmonary:     Effort: Pulmonary effort is normal. No respiratory distress.     Breath sounds: Normal breath sounds. No stridor. No wheezing, rhonchi or rales.  Abdominal:     General: Abdomen is flat. Bowel sounds are normal. There is no distension.     Palpations: Abdomen is soft.     Tenderness: There is no abdominal tenderness.  Musculoskeletal:        General: Normal range of motion.     Cervical back: Normal range of motion.  Skin:    General: Skin is warm and dry.  Neurological:     General: No focal deficit present.     Mental Status: She is alert and oriented to person, place, and time. Mental status is at baseline.  Psychiatric:        Mood and Affect: Mood normal.        Behavior: Behavior normal.      UC Treatments / Results  Labs (all labs ordered are listed, but only abnormal results are displayed) Labs Reviewed - No data to display  EKG   Radiology No results found.  Procedures Procedures (including critical care time)  Medications Ordered in UC Medications - No data to display  Initial Impression / Assessment and Plan / UC Course  I have reviewed the triage vital signs and the nursing notes.  Pertinent labs & imaging results that were available during my care of the patient were reviewed by me and considered in my medical decision making (  see chart for details).     Patient's symptoms appear viral in  etiology.  No concern for acute abdomen or dehydration on exam.  Therefore, do not think that emergent evaluation is necessary.  Will send nausea medication and medications to alleviate symptoms as it should run its course and be treated symptomatically.  Discussed clear oral fluid intake and bland diet to prevent nausea and vomiting.  Advised strict return and ER precautions.  Patient verbalized understanding and was agreeable with plan. Final Clinical Impressions(s) / UC Diagnoses   Final diagnoses:  Viral illness  Nausea vomiting and diarrhea     Discharge Instructions      It appears that you have a viral illness which should run its course and self resolve with symptomatic treatment.  I have prescribed you 3 medications including a nausea medication to take as needed.  Please ensure adequate fluid hydration with clear oral fluids that include water, Gatorade, Pedialyte.  Ensure a bland diet.  See instructions attached for this.  Follow-up if symptoms persist or worsen.    ED Prescriptions     Medication Sig Dispense Auth. Provider   ondansetron (ZOFRAN-ODT) 4 MG disintegrating tablet Take 1 tablet (4 mg total) by mouth every 8 (eight) hours as needed for nausea or vomiting. 20 tablet [place], Livengood E, [place]   fluticasone [facility]) 50 MCG/ACT nasal spray Place 1 spray into both nostrils daily. 16 g Shantell Belongia, Hildred Alamin E, [place]   benzonatate (TESSALON) 100 MG capsule Take 1 capsule (100 mg total) by mouth every 8 (eight) hours as needed for cough. 21 capsule [place], Michele Rockers, [place]      PDMP not reviewed this encounter.   Teodora Medici, [place] 06/30/22 1124

## 2022-06-30 NOTE — Discharge Instructions (Addendum)
It appears that you have a viral illness which should run its course and self resolve with symptomatic treatment.  I have prescribed you 3 medications including a nausea medication to take as needed.  Please ensure adequate fluid hydration with clear oral fluids that include water, Gatorade, Pedialyte.  Ensure a bland diet.  See instructions attached for this.  Follow-up if symptoms persist or worsen.

## 2022-07-03 ENCOUNTER — Ambulatory Visit
Admission: EM | Admit: 2022-07-03 | Discharge: 2022-07-03 | Disposition: A | Discharge status: Home / Self Care | Payer: BC Managed Care – PPO

## 2022-07-03 DIAGNOSIS — B349 Viral infection, unspecified: Secondary | ICD-10-CM

## 2022-07-03 NOTE — ED Triage Notes (Signed)
Pt c/o fatigue, malaise, states was seen on Friday and given 3 days provider note for work. States she did not want to be reevaluated today but needs an extended work note.   Other sxs include diarrhea, nausea, feeling like someone kicked pt in stomach, cough, nasal drainage. States things have gotten better since Friday just not enough to return to work.

## 2022-07-06 NOTE — ED Provider Notes (Signed)
[facility]    CSN: [id number] Arrival date & time: 07/03/22  0905      History   Chief Complaint Chief Complaint  Patient presents with   Fatigue    HPI Terri Parsons is a 51 y.o. female.   Pt request an extended work note.  Pt reporst he was seen and given a note to be out of work.  Pt reports he still feels sick and does not feel well enough to return to work. Pt coplains of cough and bodyaches      Past Medical History:  Diagnosis Date   Prediabetes    Vitamin D deficiency     There are no problems to display for this patient.   Past Surgical History:  Procedure Laterality Date   TONSILLECTOMY     TOTAL HIP ARTHROPLASTY Right 11/2021    OB History   No obstetric history on file.      Home Medications    Prior to Admission medications   Medication Sig Start Date End Date Taking? Authorizing Provider  benzonatate (TESSALON) 100 MG capsule Take 1 capsule (100 mg total) by mouth every 8 (eight) hours as needed for cough. 06/30/22   Teodora Medici, FNP  fluticasone (FLONASE) 50 MCG/ACT nasal spray Place 1 spray into both nostrils daily. 06/30/22   Teodora Medici, FNP  MULTIPLE VITAMIN-FOLIC ACID PO daily.    [provider]  Na Sulfate-K Sulfate-Mg Sulf 17.5-3.13-1.6 GM/177ML SOLN Take 1 kit by mouth as directed. May use generic Suprep, no prior authorization. Take as directed. 03/27/22   Sharyn Creamer, MD  ondansetron (ZOFRAN-ODT) 4 MG disintegrating tablet Take 1 tablet (4 mg total) by mouth every 8 (eight) hours as needed for nausea or vomiting. 06/30/22   Teodora Medici, FNP    Family History Family History  Problem Relation Age of Onset   Renal Disease Mother    High blood pressure Mother    Early death Mother    Heart attack Father    Heart disease Father    Suicidality Sister    Suicidality Brother    Cancer Maternal Grandmother    Heart attack Paternal Grandmother    Heart disease Paternal Grandmother    Breast  cancer Neg Hx    Colon cancer Neg Hx    Colon polyps Neg Hx    Esophageal cancer Neg Hx    Rectal cancer Neg Hx    Stomach cancer Neg Hx     Social History Social History   Tobacco Use   Smoking status: Former    Packs/day: 0.50    Years: 20.00    Total pack years: 10.00    Types: Cigarettes   Smokeless tobacco: Never  Substance Use Topics   Alcohol use: Yes    Comment: weekend   Drug use: Never     Allergies   Sulfa antibiotics   Review of Systems Review of Systems  All other systems reviewed and are negative.    Physical Exam Triage Vital Signs ED Triage Vitals [07/03/22 0950]  Enc Vitals Group     BP 105/73     Pulse Rate 89     Resp 16     Temp 99.3 F (37.4 C)     Temp Source Oral     SpO2 98 %     Weight      Height      Head Circumference      Peak Flow  Pain Score 6     Pain Loc      Pain Edu?      Excl. in [place]?    No data found.  Updated Vital Signs BP 105/73 (BP Location: Left Arm)   Pulse 89   Temp 99.3 F (37.4 C) (Oral)   Resp 16   SpO2 98%   Visual Acuity Right Eye Distance:   Left Eye Distance:   Bilateral Distance:    Right Eye Near:   Left Eye Near:    Bilateral Near:     Physical Exam Vitals and nursing note reviewed.  Constitutional:      Appearance: She is well-developed.  HENT:     Head: Normocephalic.  Cardiovascular:     Rate and Rhythm: Normal rate.  Pulmonary:     Effort: Pulmonary effort is normal.  Abdominal:     General: There is no distension.  Musculoskeletal:        General: Normal range of motion.     Cervical back: Normal range of motion.  Skin:    General: Skin is warm.  Neurological:     General: No focal deficit present.     Mental Status: She is alert and oriented to person, place, and time.      UC Treatments / Results  Labs (all labs ordered are listed, but only abnormal results are displayed) Labs Reviewed - No data to display  EKG   Radiology No results  found.  Procedures Procedures (including critical care time)  Medications Ordered in UC Medications - No data to display  Initial Impression / Assessment and Plan / UC Course  I have reviewed the triage vital signs and the nursing notes.  Pertinent labs & imaging results that were available during my care of the patient were reviewed by me and considered in my medical decision making (see chart for details).     Pt given note work out of work x 3 days  Final Clinical Impressions(s) / UC Diagnoses   Final diagnoses:  Viral illness   Discharge Instructions   None    ED Prescriptions   None    PDMP not reviewed this encounter. An After Visit Summary was printed and given to the patient.    Fransico Meadow, [place] 07/06/22 1519

## 2022-07-10 DIAGNOSIS — M9902 Segmental and somatic dysfunction of thoracic region: Secondary | ICD-10-CM | POA: Diagnosis not present

## 2022-07-10 DIAGNOSIS — M9903 Segmental and somatic dysfunction of lumbar region: Secondary | ICD-10-CM | POA: Diagnosis not present

## 2022-07-10 DIAGNOSIS — M9901 Segmental and somatic dysfunction of cervical region: Secondary | ICD-10-CM | POA: Diagnosis not present

## 2022-07-10 DIAGNOSIS — M9905 Segmental and somatic dysfunction of pelvic region: Secondary | ICD-10-CM | POA: Diagnosis not present

## 2022-07-12 DIAGNOSIS — M9902 Segmental and somatic dysfunction of thoracic region: Secondary | ICD-10-CM | POA: Diagnosis not present

## 2022-07-12 DIAGNOSIS — M9901 Segmental and somatic dysfunction of cervical region: Secondary | ICD-10-CM | POA: Diagnosis not present

## 2022-07-12 DIAGNOSIS — M9903 Segmental and somatic dysfunction of lumbar region: Secondary | ICD-10-CM | POA: Diagnosis not present

## 2022-07-12 DIAGNOSIS — M9905 Segmental and somatic dysfunction of pelvic region: Secondary | ICD-10-CM | POA: Diagnosis not present

## 2022-07-17 DIAGNOSIS — M9902 Segmental and somatic dysfunction of thoracic region: Secondary | ICD-10-CM | POA: Diagnosis not present

## 2022-07-17 DIAGNOSIS — M9905 Segmental and somatic dysfunction of pelvic region: Secondary | ICD-10-CM | POA: Diagnosis not present

## 2022-07-17 DIAGNOSIS — M9901 Segmental and somatic dysfunction of cervical region: Secondary | ICD-10-CM | POA: Diagnosis not present

## 2022-07-17 DIAGNOSIS — M9903 Segmental and somatic dysfunction of lumbar region: Secondary | ICD-10-CM | POA: Diagnosis not present

## 2022-07-19 DIAGNOSIS — M9901 Segmental and somatic dysfunction of cervical region: Secondary | ICD-10-CM | POA: Diagnosis not present

## 2022-07-19 DIAGNOSIS — M9905 Segmental and somatic dysfunction of pelvic region: Secondary | ICD-10-CM | POA: Diagnosis not present

## 2022-07-19 DIAGNOSIS — M9902 Segmental and somatic dysfunction of thoracic region: Secondary | ICD-10-CM | POA: Diagnosis not present

## 2022-07-19 DIAGNOSIS — M9903 Segmental and somatic dysfunction of lumbar region: Secondary | ICD-10-CM | POA: Diagnosis not present

## 2022-08-07 DIAGNOSIS — M9903 Segmental and somatic dysfunction of lumbar region: Secondary | ICD-10-CM | POA: Diagnosis not present

## 2022-08-07 DIAGNOSIS — M9902 Segmental and somatic dysfunction of thoracic region: Secondary | ICD-10-CM | POA: Diagnosis not present

## 2022-08-07 DIAGNOSIS — M9905 Segmental and somatic dysfunction of pelvic region: Secondary | ICD-10-CM | POA: Diagnosis not present

## 2022-08-07 DIAGNOSIS — M9901 Segmental and somatic dysfunction of cervical region: Secondary | ICD-10-CM | POA: Diagnosis not present

## 2022-08-09 DIAGNOSIS — M9903 Segmental and somatic dysfunction of lumbar region: Secondary | ICD-10-CM | POA: Diagnosis not present

## 2022-08-09 DIAGNOSIS — M9905 Segmental and somatic dysfunction of pelvic region: Secondary | ICD-10-CM | POA: Diagnosis not present

## 2022-08-09 DIAGNOSIS — M9901 Segmental and somatic dysfunction of cervical region: Secondary | ICD-10-CM | POA: Diagnosis not present

## 2022-08-09 DIAGNOSIS — M9902 Segmental and somatic dysfunction of thoracic region: Secondary | ICD-10-CM | POA: Diagnosis not present

## 2022-08-14 DIAGNOSIS — M9903 Segmental and somatic dysfunction of lumbar region: Secondary | ICD-10-CM | POA: Diagnosis not present

## 2022-08-14 DIAGNOSIS — M9902 Segmental and somatic dysfunction of thoracic region: Secondary | ICD-10-CM | POA: Diagnosis not present

## 2022-08-14 DIAGNOSIS — M9901 Segmental and somatic dysfunction of cervical region: Secondary | ICD-10-CM | POA: Diagnosis not present

## 2022-08-14 DIAGNOSIS — M9905 Segmental and somatic dysfunction of pelvic region: Secondary | ICD-10-CM | POA: Diagnosis not present

## 2023-01-22 DIAGNOSIS — M9903 Segmental and somatic dysfunction of lumbar region: Secondary | ICD-10-CM | POA: Diagnosis not present

## 2023-01-22 DIAGNOSIS — M9905 Segmental and somatic dysfunction of pelvic region: Secondary | ICD-10-CM | POA: Diagnosis not present

## 2023-01-22 DIAGNOSIS — M9902 Segmental and somatic dysfunction of thoracic region: Secondary | ICD-10-CM | POA: Diagnosis not present

## 2023-01-22 DIAGNOSIS — M9901 Segmental and somatic dysfunction of cervical region: Secondary | ICD-10-CM | POA: Diagnosis not present

## 2023-01-22 DIAGNOSIS — M5032 Other cervical disc degeneration, mid-cervical region, unspecified level: Secondary | ICD-10-CM | POA: Diagnosis not present

## 2023-01-29 DIAGNOSIS — M5032 Other cervical disc degeneration, mid-cervical region, unspecified level: Secondary | ICD-10-CM | POA: Diagnosis not present

## 2023-01-29 DIAGNOSIS — M9905 Segmental and somatic dysfunction of pelvic region: Secondary | ICD-10-CM | POA: Diagnosis not present

## 2023-01-29 DIAGNOSIS — M9902 Segmental and somatic dysfunction of thoracic region: Secondary | ICD-10-CM | POA: Diagnosis not present

## 2023-01-29 DIAGNOSIS — M9901 Segmental and somatic dysfunction of cervical region: Secondary | ICD-10-CM | POA: Diagnosis not present

## 2023-01-29 DIAGNOSIS — M9903 Segmental and somatic dysfunction of lumbar region: Secondary | ICD-10-CM | POA: Diagnosis not present

## 2023-01-29 DIAGNOSIS — M722 Plantar fascial fibromatosis: Secondary | ICD-10-CM | POA: Diagnosis not present

## 2023-02-05 DIAGNOSIS — M722 Plantar fascial fibromatosis: Secondary | ICD-10-CM | POA: Diagnosis not present

## 2023-02-05 DIAGNOSIS — M9903 Segmental and somatic dysfunction of lumbar region: Secondary | ICD-10-CM | POA: Diagnosis not present

## 2023-02-05 DIAGNOSIS — M9901 Segmental and somatic dysfunction of cervical region: Secondary | ICD-10-CM | POA: Diagnosis not present

## 2023-02-05 DIAGNOSIS — M5032 Other cervical disc degeneration, mid-cervical region, unspecified level: Secondary | ICD-10-CM | POA: Diagnosis not present

## 2023-02-05 DIAGNOSIS — M9905 Segmental and somatic dysfunction of pelvic region: Secondary | ICD-10-CM | POA: Diagnosis not present

## 2023-02-05 DIAGNOSIS — M9902 Segmental and somatic dysfunction of thoracic region: Secondary | ICD-10-CM | POA: Diagnosis not present

## 2023-03-12 DIAGNOSIS — M5032 Other cervical disc degeneration, mid-cervical region, unspecified level: Secondary | ICD-10-CM | POA: Diagnosis not present

## 2023-03-12 DIAGNOSIS — M9902 Segmental and somatic dysfunction of thoracic region: Secondary | ICD-10-CM | POA: Diagnosis not present

## 2023-03-12 DIAGNOSIS — M722 Plantar fascial fibromatosis: Secondary | ICD-10-CM | POA: Diagnosis not present

## 2023-03-12 DIAGNOSIS — M9905 Segmental and somatic dysfunction of pelvic region: Secondary | ICD-10-CM | POA: Diagnosis not present

## 2023-03-12 DIAGNOSIS — M9901 Segmental and somatic dysfunction of cervical region: Secondary | ICD-10-CM | POA: Diagnosis not present

## 2023-03-12 DIAGNOSIS — M9903 Segmental and somatic dysfunction of lumbar region: Secondary | ICD-10-CM | POA: Diagnosis not present

## 2023-03-21 ENCOUNTER — Ambulatory Visit: Payer: BC Managed Care – PPO | Admitting: Family Medicine

## 2023-03-21 ENCOUNTER — Encounter: Payer: Self-pay | Admitting: Family Medicine

## 2023-03-21 VITALS — BP 108/70 | HR 79 | Temp 97.5°F | Ht 64.0 in | Wt 171.0 lb

## 2023-03-21 DIAGNOSIS — R350 Frequency of micturition: Secondary | ICD-10-CM | POA: Diagnosis not present

## 2023-03-21 DIAGNOSIS — Z1211 Encounter for screening for malignant neoplasm of colon: Secondary | ICD-10-CM

## 2023-03-21 DIAGNOSIS — E559 Vitamin D deficiency, unspecified: Secondary | ICD-10-CM | POA: Diagnosis not present

## 2023-03-21 DIAGNOSIS — M2550 Pain in unspecified joint: Secondary | ICD-10-CM | POA: Diagnosis not present

## 2023-03-21 DIAGNOSIS — Z87898 Personal history of other specified conditions: Secondary | ICD-10-CM | POA: Diagnosis not present

## 2023-03-21 DIAGNOSIS — R202 Paresthesia of skin: Secondary | ICD-10-CM

## 2023-03-21 DIAGNOSIS — Z113 Encounter for screening for infections with a predominantly sexual mode of transmission: Secondary | ICD-10-CM | POA: Diagnosis not present

## 2023-03-21 DIAGNOSIS — M25641 Stiffness of right hand, not elsewhere classified: Secondary | ICD-10-CM | POA: Diagnosis not present

## 2023-03-21 DIAGNOSIS — R232 Flushing: Secondary | ICD-10-CM | POA: Diagnosis not present

## 2023-03-21 DIAGNOSIS — Z0000 Encounter for general adult medical examination without abnormal findings: Secondary | ICD-10-CM

## 2023-03-21 DIAGNOSIS — F439 Reaction to severe stress, unspecified: Secondary | ICD-10-CM | POA: Diagnosis not present

## 2023-03-21 DIAGNOSIS — Z0001 Encounter for general adult medical examination with abnormal findings: Secondary | ICD-10-CM | POA: Insufficient documentation

## 2023-03-21 LAB — CBC WITH DIFFERENTIAL/PLATELET
Basophils Absolute: 0 10*3/uL (ref 0.0–0.1)
Basophils Relative: 0.5 % (ref 0.0–3.0)
Eosinophils Absolute: 0.1 10*3/uL (ref 0.0–0.7)
Eosinophils Relative: 2.8 % (ref 0.0–5.0)
HCT: 39.7 % (ref 36.0–46.0)
Hemoglobin: 13.1 g/dL (ref 12.0–15.0)
Lymphocytes Relative: 38.3 % (ref 12.0–46.0)
Lymphs Abs: 1.4 10*3/uL (ref 0.7–4.0)
MCHC: 33 g/dL (ref 30.0–36.0)
MCV: 93.1 fl (ref 78.0–100.0)
Monocytes Absolute: 0.3 10*3/uL (ref 0.1–1.0)
Monocytes Relative: 8.7 % (ref 3.0–12.0)
Neutro Abs: 1.9 10*3/uL (ref 1.4–7.7)
Neutrophils Relative %: 49.7 % (ref 43.0–77.0)
Platelets: 271 10*3/uL (ref 150.0–400.0)
RBC: 4.27 Mil/uL (ref 3.87–5.11)
RDW: 12.8 % (ref 11.5–15.5)
WBC: 3.8 10*3/uL — ABNORMAL LOW (ref 4.0–10.5)

## 2023-03-21 LAB — COMPREHENSIVE METABOLIC PANEL
ALT: 23 U/L (ref 0–35)
AST: 26 U/L (ref 0–37)
Albumin: 4.1 g/dL (ref 3.5–5.2)
Alkaline Phosphatase: 68 U/L (ref 39–117)
BUN: 9 mg/dL (ref 6–23)
CO2: 31 meq/L (ref 19–32)
Calcium: 9.2 mg/dL (ref 8.4–10.5)
Chloride: 103 meq/L (ref 96–112)
Creatinine, Ser: 0.84 mg/dL (ref 0.40–1.20)
GFR: 79.81 mL/min (ref >60.00)
Glucose, Bld: 86 mg/dL (ref 70–99)
Potassium: 3.6 meq/L (ref 3.5–5.1)
Sodium: 139 meq/L (ref 135–145)
Total Bilirubin: 0.6 mg/dL (ref 0.2–1.2)
Total Protein: 6.7 g/dL (ref 6.0–8.3)

## 2023-03-21 LAB — HEMOGLOBIN A1C: Hgb A1c MFr Bld: 5.8 % (ref 4.6–6.5)

## 2023-03-21 LAB — C-REACTIVE PROTEIN: CRP: <1 mg/dL (ref 0.5–20.0)

## 2023-03-21 LAB — SEDIMENTATION RATE: Sed Rate: 4 mm/h (ref 0–30)

## 2023-03-21 NOTE — Progress Notes (Signed)
Complete physical exam  Patient: Terri Parsons   DOB: 09-06-1970   52 y.o. Female  MRN: [id number]  Subjective:    Chief Complaint  Patient presents with   Annual Exam    Pt states she's going through menopause and is having trouble with her hot flashes    She is here for a complete physical exam.  Other providers: Orthopedic- Dr. 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 Mindi Slicker next month.   LMP:  12 months ago   C/o 6 month hx of hot flashes. No significant night sweats. She has not tried anything for these. Stress is a big trigger.   C/o finger stiffness on right hand. Paresthesias of lower extremities. She has already had a hip replacement.     Health Maintenance  Topic Date Due   Pap Smear  Never done   Colon Cancer Screening  Never done   Zoster (Shingles) Vaccine (1 of 2) Never done   COVID-19 Vaccine (3 - 2023-24 season) 04/06/2023*   Flu Shot  10/29/2023*   Mammogram  07/28/2023   DTaP/Tdap/Td vaccine (2 - Td or Tdap) 11/17/2031   Hepatitis C Screening  Completed   HIV Screening  Completed   HPV Vaccine  Aged Out  *Topic was postponed. The date shown is not the original due date.    Wears seatbelt always, uses sunscreen, smoke detectors in home and functioning, does not text while driving, feels safe in home environment.  Depression screening:    03/21/2023    3:43 PM 11/16/2021    1:08 PM 10/27/2021   11:07 AM  Depression screen PHQ 2/9  Decreased Interest 0 1 2  Down, Depressed, Hopeless 0 1 1  PHQ - 2 Score 0 2 3  Altered sleeping   1  Tired, decreased energy   1  Change in appetite   0  Feeling bad or failure about yourself    0  Trouble concentrating   0  Moving slowly or fidgety/restless   0  Suicidal thoughts   0  PHQ-9 Score   5   Anxiety Screening:     No data to display          Vision:Within last year, Dental: No current dental problems and Receives  regular dental care, and STD: requests testing due to new partner   Patient Active Problem List   Diagnosis Date Noted   Paresthesias 03/21/2023   History of prediabetes 03/21/2023   Vitamin D deficiency 03/21/2023   Hot flashes 03/21/2023   Screen for STD (sexually transmitted disease) 03/21/2023   Finger stiffness, right 03/21/2023   Urinary frequency 03/21/2023   Arthralgia 03/21/2023   Screen for colon cancer 03/21/2023   Encounter for general adult medical examination with abnormal findings 03/21/2023   Past Medical History:  Diagnosis Date   Prediabetes    Vitamin D deficiency    Past Surgical History:  Procedure Laterality Date   TONSILLECTOMY     TOTAL HIP ARTHROPLASTY Right 11/2021   Social History   Tobacco Use   Smoking status: Former    Current packs/day: 0.50    Average packs/day: 0.5 packs/day for 20.0 years (10.0 ttl pk-yrs)    Types: Cigarettes   Smokeless tobacco: Never  Substance Use Topics   Alcohol use: Yes    Comment: weekend   Drug use: Never   Social History   Socioeconomic History  Marital status: Married    Spouse name: Not on file   Number of children: Not on file   Years of education: Not on file   Highest education level: Some college, no degree  Occupational History   Not on file  Tobacco Use   Smoking status: Former    Current packs/day: 0.50    Average packs/day: 0.5 packs/day for 20.0 years (10.0 ttl pk-yrs)    Types: Cigarettes   Smokeless tobacco: Never  Substance and Sexual Activity   Alcohol use: Yes    Comment: weekend   Drug use: Never   Sexual activity: Not on file  Other Topics Concern   Not on file  Social History Narrative   Not on file   Social Determinants of Health   Financial Resource Strain: Medium Risk (03/19/2023)   Overall Financial Resource Strain (CARDIA)    Difficulty of Paying Living Expenses: Somewhat hard  Food Insecurity: Food Insecurity Present (03/19/2023)   Hunger Vital Sign    Worried  About Running Out of Food in the Last Year: Often true    Ran Out of Food in the Last Year: Often true  Transportation Needs: No Transportation Needs (03/19/2023)   PRAPARE - Administrator, Civil Service (Medical): No    Lack of Transportation (Non-Medical): No  Physical Activity: Unknown (03/19/2023)   Exercise Vital Sign    Days of Exercise per Week: 0 days    Minutes of Exercise per Session: Not on file  Stress: Stress Concern Present (03/19/2023)   [employer] of Occupational Health - Occupational Stress Questionnaire    Feeling of Stress : Rather much  Social Connections: Moderately Isolated (03/19/2023)   Social Connection and Isolation Panel [NHANES]    Frequency of Communication with Friends and Family: Once a week    Frequency of Social Gatherings with Friends and Family: Once a week    Attends Religious Services: 1 to 4 times per year    Active Member of [employer] or Organizations: No    Attends Banker Meetings: Not on file    Marital Status: Married  Intimate Partner Violence: Not on file (09/09/2019)      Patient Care Team: Avanell Shackleton, NP-C as PCP - General (Family Medicine)   Outpatient Medications Prior to Visit  Medication Sig   [DISCONTINUED] benzonatate (TESSALON) 100 MG capsule Take 1 capsule (100 mg total) by mouth every 8 (eight) hours as needed for cough.   [DISCONTINUED] fluticasone (FLONASE) 50 MCG/ACT nasal spray Place 1 spray into both nostrils daily.   [DISCONTINUED] MULTIPLE VITAMIN-FOLIC ACID PO daily.   [DISCONTINUED] Na Sulfate-K Sulfate-Mg Sulf 17.5-3.13-1.6 GM/177ML SOLN Take 1 kit by mouth as directed. May use generic Suprep, no prior authorization. Take as directed.   [DISCONTINUED] ondansetron (ZOFRAN-ODT) 4 MG disintegrating tablet Take 1 tablet (4 mg total) by mouth every 8 (eight) hours as needed for nausea or vomiting.   No facility-administered medications prior to visit.    Review of Systems  Constitutional:   Positive for diaphoresis. Negative for chills and fever.  HENT:  Negative for congestion, ear pain, sinus pain and sore throat.   Eyes:  Negative for blurred vision, double vision and pain.  Respiratory:  Negative for cough, shortness of breath and wheezing.   Cardiovascular:  Negative for chest pain, palpitations and leg swelling.  Gastrointestinal:  Negative for abdominal pain, constipation, diarrhea, nausea and vomiting.  Genitourinary:  Positive for frequency. Negative for dysuria and urgency.  Musculoskeletal:  Positive for joint pain. Negative for back pain and myalgias.  Skin:  Negative for rash.  Neurological:  Positive for tingling and sensory change. Negative for dizziness, focal weakness and headaches.  Psychiatric/Behavioral:  Negative for depression. The patient is not nervous/anxious.        Objective:    BP 108/70 (BP Location: Left Arm, Patient Position: Sitting, Cuff Size: Normal)   Pulse 79   Temp (!) 97.5 F (36.4 C) (Oral)   Ht 5\' 4"  (1.626 m)   Wt 171 lb (77.6 kg)   SpO2 96%   BMI 29.35 kg/m  BP Readings from Last 3 Encounters:  03/21/23 108/70  07/03/22 105/73  06/30/22 106/73   Wt Readings from Last 3 Encounters:  03/21/23 171 lb (77.6 kg)  03/27/22 155 lb (70.3 kg)  11/16/21 153 lb 12.8 oz (69.8 kg)    Physical Exam Constitutional:      General: She is not in acute distress.    Appearance: She is not ill-appearing.  HENT:     Right Ear: Tympanic membrane, ear canal and external ear normal.     Left Ear: Tympanic membrane, ear canal and external ear normal.     Nose: Nose normal.     Mouth/Throat:     Mouth: Mucous membranes are moist.     Pharynx: Oropharynx is clear.  Eyes:     Extraocular Movements: Extraocular movements intact.     Conjunctiva/sclera: Conjunctivae normal.     Pupils: Pupils are equal, round, and reactive to light.  Neck:     Thyroid: No thyroid mass, thyromegaly or thyroid tenderness.  Cardiovascular:     Rate and  Rhythm: Normal rate and regular rhythm.     Pulses: Normal pulses.  Pulmonary:     Effort: Pulmonary effort is normal.     Breath sounds: Normal breath sounds.  Abdominal:     General: Bowel sounds are normal. There is no distension.     Palpations: Abdomen is soft.     Tenderness: There is no abdominal tenderness. There is no right CVA tenderness, left CVA tenderness, guarding or rebound.  Musculoskeletal:        General: Normal range of motion.     Cervical back: Normal range of motion and neck supple. No tenderness.     Right lower leg: No edema.     Left lower leg: No edema.  Lymphadenopathy:     Cervical: No cervical adenopathy.  Skin:    General: Skin is warm and dry.     Findings: No lesion or rash.  Neurological:     General: No focal deficit present.     Mental Status: She is alert and oriented to person, place, and time.     Cranial Nerves: No cranial nerve deficit.     Sensory: No sensory deficit.     Motor: No weakness.     Gait: Gait normal.  Psychiatric:        Mood and Affect: Mood normal.        Behavior: Behavior normal.        Thought Content: Thought content normal.      No results found for any visits on 03/21/23.    Assessment & Plan:    Routine Health Maintenance and Physical Exam  Problem List Items Addressed This Visit       Cardiovascular and Mediastinum   Hot flashes    LMP >12 months ago. Check labs. Try OTC Black Cohosh  Relevant Orders   TSH   T4, free     Endocrine   History of prediabetes    Check A1c       Relevant Orders   Hemoglobin A1c     Other   Arthralgia    Check autoimmune and inflammatory markers      Relevant Orders   Sedimentation rate   C-reactive protein   ANA   Rheumatoid factor   Encounter for general adult medical examination with abnormal findings - Primary    Preventive health care reviewed.  Counseling on healthy lifestyle including diet and exercise.  Recommend regular dental and eye exams.   Immunizations reviewed.  Discussed safety.       Finger stiffness, right    Check autoimmune and inflammatory markers      Relevant Orders   Sedimentation rate   C-reactive protein   ANA   Rheumatoid factor   Paresthesias    Check labs and follow up      Relevant Orders   CBC with Differential/Platelet   Comprehensive metabolic panel   TSH   T4, free   Vitamin B12   Sedimentation rate   C-reactive protein   ANA   Rheumatoid factor   Screen for colon cancer    She did not schedule last year but is amenable to colonoscopy now.       Relevant Orders   Ambulatory referral to Gastroenterology   Screen for STD (sexually transmitted disease)    Check labs and urine and follow up. She will see OB/GYN next month.       Relevant Orders   Hepatitis C antibody   RPR   HIV Antibody (routine testing w rflx)   GC/Chlamydia Probe Amp   Hepatitis B surface antigen   Urinary frequency    Urinalysis ordered. She has seen urology in the past and negative work up.       Relevant Orders   Urinalysis, Routine w reflex microscopic   Vitamin D deficiency    Check vitamin D level and replace as warranted       Relevant Orders   VITAMIN D 25 Hydroxy (Vit-D Deficiency, Fractures)   Other Visit Diagnoses     Stress           Return in about 1 year (around 03/20/2024).     Hetty Blend, NP-C

## 2023-03-21 NOTE — Assessment & Plan Note (Signed)
Preventive health care reviewed.  Counseling on healthy lifestyle including diet and exercise.  Recommend regular dental and eye exams.  Immunizations reviewed.  Discussed safety.  

## 2023-03-21 NOTE — Assessment & Plan Note (Signed)
She did not schedule last year but is amenable to colonoscopy now.

## 2023-03-21 NOTE — Assessment & Plan Note (Signed)
Urinalysis ordered. She has seen urology in the past and negative work up.

## 2023-03-21 NOTE — Assessment & Plan Note (Signed)
Check A1c. 

## 2023-03-21 NOTE — Assessment & Plan Note (Signed)
Check labs and follow up 

## 2023-03-21 NOTE — Assessment & Plan Note (Signed)
Check labs and urine and follow up. She will see OB/GYN next month.

## 2023-03-21 NOTE — Assessment & Plan Note (Signed)
LMP >12 months ago. Check labs. Try OTC Black Cohosh

## 2023-03-21 NOTE — Assessment & Plan Note (Signed)
Check autoimmune and inflammatory markers

## 2023-03-21 NOTE — Patient Instructions (Addendum)
Check with your insurance carrier and if you want to get the shingles vaccines and they are affordable, you can schedule a nurse visit to get these.   Try taking over the counter Black Cohosh or Estroven to see if this help with your hot flashes. Discuss with your OB/GYN next month.   I have referred you to [facility] and they will call you.

## 2023-03-21 NOTE — Assessment & Plan Note (Signed)
Check vitamin D level and replace as warranted

## 2023-03-22 LAB — URINALYSIS, ROUTINE W REFLEX MICROSCOPIC
Bilirubin Urine: NEGATIVE
Hgb urine dipstick: NEGATIVE
Ketones, ur: NEGATIVE
Leukocytes,Ua: NEGATIVE
Nitrite: NEGATIVE
Specific Gravity, Urine: 1.02 (ref 1.000–1.030)
Total Protein, Urine: NEGATIVE
Urine Glucose: NEGATIVE
Urobilinogen, UA: 1 (ref 0.0–1.0)
pH: 7 (ref 5.0–8.0)

## 2023-03-23 LAB — TSH: TSH: 0.85 u[IU]/mL (ref 0.35–5.50)

## 2023-03-23 LAB — VITAMIN D 25 HYDROXY (VIT D DEFICIENCY, FRACTURES): VITD: 52.02 ng/mL (ref 30.00–100.00)

## 2023-03-23 LAB — VITAMIN B12: Vitamin B-12: 206 pg/mL — ABNORMAL LOW (ref 211–911)

## 2023-03-23 LAB — T4, FREE: Free T4: 1.01 ng/dL (ref 0.60–1.60)

## 2023-03-24 LAB — HEPATITIS C ANTIBODY: Hepatitis C Ab: NONREACTIVE

## 2023-03-24 LAB — ANA: Anti Nuclear Antibody (ANA): NEGATIVE

## 2023-03-24 LAB — RPR: RPR Ser Ql: NONREACTIVE

## 2023-03-24 LAB — HIV ANTIBODY (ROUTINE TESTING W REFLEX): HIV 1&2 Ab, 4th Generation: NONREACTIVE

## 2023-03-24 LAB — HEPATITIS B SURFACE ANTIGEN: Hepatitis B Surface Ag: NONREACTIVE

## 2023-03-24 LAB — RHEUMATOID FACTOR: Rheumatoid fact SerPl-aCnc: <10 [IU]/mL (ref <14)

## 2023-03-25 LAB — GC/CHLAMYDIA PROBE AMP
Chlamydia trachomatis, NAA: NEGATIVE
Neisseria Gonorrhoeae by PCR: NEGATIVE

## 2023-04-16 DIAGNOSIS — M9905 Segmental and somatic dysfunction of pelvic region: Secondary | ICD-10-CM | POA: Diagnosis not present

## 2023-04-16 DIAGNOSIS — M9901 Segmental and somatic dysfunction of cervical region: Secondary | ICD-10-CM | POA: Diagnosis not present

## 2023-04-16 DIAGNOSIS — M5032 Other cervical disc degeneration, mid-cervical region, unspecified level: Secondary | ICD-10-CM | POA: Diagnosis not present

## 2023-04-16 DIAGNOSIS — M9902 Segmental and somatic dysfunction of thoracic region: Secondary | ICD-10-CM | POA: Diagnosis not present

## 2023-04-16 DIAGNOSIS — M9903 Segmental and somatic dysfunction of lumbar region: Secondary | ICD-10-CM | POA: Diagnosis not present

## 2023-04-18 ENCOUNTER — Encounter: Payer: BC Managed Care – PPO | Admitting: Family Medicine

## 2023-04-23 DIAGNOSIS — N951 Menopausal and female climacteric states: Secondary | ICD-10-CM | POA: Diagnosis not present

## 2023-04-23 DIAGNOSIS — Z01411 Encounter for gynecological examination (general) (routine) with abnormal findings: Secondary | ICD-10-CM | POA: Diagnosis not present

## 2023-04-23 DIAGNOSIS — Z113 Encounter for screening for infections with a predominantly sexual mode of transmission: Secondary | ICD-10-CM | POA: Diagnosis not present

## 2023-04-23 DIAGNOSIS — Z23 Encounter for immunization: Secondary | ICD-10-CM | POA: Diagnosis not present

## 2023-04-23 DIAGNOSIS — Z130 Encounter for screening for diseases of the blood and blood-forming organs and certain disorders involving the immune mechanism: Secondary | ICD-10-CM | POA: Diagnosis not present

## 2023-04-23 DIAGNOSIS — Z1231 Encounter for screening mammogram for malignant neoplasm of breast: Secondary | ICD-10-CM | POA: Diagnosis not present

## 2023-05-28 DIAGNOSIS — M9901 Segmental and somatic dysfunction of cervical region: Secondary | ICD-10-CM | POA: Diagnosis not present

## 2023-05-28 DIAGNOSIS — M9905 Segmental and somatic dysfunction of pelvic region: Secondary | ICD-10-CM | POA: Diagnosis not present

## 2023-05-28 DIAGNOSIS — M5032 Other cervical disc degeneration, mid-cervical region, unspecified level: Secondary | ICD-10-CM | POA: Diagnosis not present

## 2023-05-28 DIAGNOSIS — M9903 Segmental and somatic dysfunction of lumbar region: Secondary | ICD-10-CM | POA: Diagnosis not present

## 2023-05-28 DIAGNOSIS — M9902 Segmental and somatic dysfunction of thoracic region: Secondary | ICD-10-CM | POA: Diagnosis not present

## 2023-06-18 DIAGNOSIS — Z113 Encounter for screening for infections with a predominantly sexual mode of transmission: Secondary | ICD-10-CM | POA: Diagnosis not present

## 2023-06-18 DIAGNOSIS — Z114 Encounter for screening for human immunodeficiency virus [HIV]: Secondary | ICD-10-CM | POA: Diagnosis not present

## 2023-06-18 DIAGNOSIS — B3731 Acute candidiasis of vulva and vagina: Secondary | ICD-10-CM | POA: Diagnosis not present

## 2023-09-19 ENCOUNTER — Ambulatory Visit: Payer: BC Managed Care – PPO | Admitting: Family Medicine

## 2023-10-31 ENCOUNTER — Ambulatory Visit: Admitting: Family Medicine

## 2024-01-28 DIAGNOSIS — I83893 Varicose veins of bilateral lower extremities with other complications: Secondary | ICD-10-CM | POA: Diagnosis not present

## 2024-01-28 DIAGNOSIS — R600 Localized edema: Secondary | ICD-10-CM | POA: Diagnosis not present

## 2024-01-28 DIAGNOSIS — I872 Venous insufficiency (chronic) (peripheral): Secondary | ICD-10-CM | POA: Diagnosis not present

## 2024-01-28 DIAGNOSIS — M79662 Pain in left lower leg: Secondary | ICD-10-CM | POA: Diagnosis not present

## 2024-02-15 ENCOUNTER — Other Ambulatory Visit: Payer: Self-pay | Admitting: Medical Genetics

## 2024-02-27 ENCOUNTER — Other Ambulatory Visit (HOSPITAL_COMMUNITY)

## 2024-04-08 ENCOUNTER — Encounter: Payer: Self-pay | Admitting: Family Medicine

## 2024-04-08 ENCOUNTER — Ambulatory Visit: Admitting: Family Medicine

## 2024-04-08 VITALS — BP 102/74 | HR 70 | Temp 97.8°F | Ht 64.0 in | Wt 179.0 lb

## 2024-04-08 DIAGNOSIS — R232 Flushing: Secondary | ICD-10-CM | POA: Diagnosis not present

## 2024-04-08 DIAGNOSIS — E66811 Obesity, class 1: Secondary | ICD-10-CM | POA: Diagnosis not present

## 2024-04-08 DIAGNOSIS — R432 Parageusia: Secondary | ICD-10-CM | POA: Diagnosis not present

## 2024-04-08 DIAGNOSIS — R0981 Nasal congestion: Secondary | ICD-10-CM

## 2024-04-08 DIAGNOSIS — Z6830 Body mass index (BMI) 30.0-30.9, adult: Secondary | ICD-10-CM | POA: Diagnosis not present

## 2024-04-08 DIAGNOSIS — E538 Deficiency of other specified B group vitamins: Secondary | ICD-10-CM

## 2024-04-08 DIAGNOSIS — R7303 Prediabetes: Secondary | ICD-10-CM

## 2024-04-08 DIAGNOSIS — M1610 Unilateral primary osteoarthritis, unspecified hip: Secondary | ICD-10-CM | POA: Insufficient documentation

## 2024-04-08 DIAGNOSIS — E559 Vitamin D deficiency, unspecified: Secondary | ICD-10-CM

## 2024-04-08 LAB — COMPREHENSIVE METABOLIC PANEL WITH GFR
ALT: 23 U/L (ref 0–35)
AST: 27 U/L (ref 0–37)
Albumin: 4.2 g/dL (ref 3.5–5.2)
Alkaline Phosphatase: 74 U/L (ref 39–117)
BUN: 12 mg/dL (ref 6–23)
CO2: 30 meq/L (ref 19–32)
Calcium: 9.7 mg/dL (ref 8.4–10.5)
Chloride: 105 meq/L (ref 96–112)
Creatinine, Ser: 0.71 mg/dL (ref 0.40–1.20)
GFR: 96.93 mL/min (ref >60.00)
Glucose, Bld: 87 mg/dL (ref 70–99)
Potassium: 3.9 meq/L (ref 3.5–5.1)
Sodium: 141 meq/L (ref 135–145)
Total Bilirubin: 0.6 mg/dL (ref 0.2–1.2)
Total Protein: 6.9 g/dL (ref 6.0–8.3)

## 2024-04-08 LAB — LIPID PANEL
Cholesterol: 181 mg/dL (ref 0–200)
HDL: 85.2 mg/dL (ref >39.00)
LDL Cholesterol: 88 mg/dL (ref 0–99)
NonHDL: 95.62
Total CHOL/HDL Ratio: 2
Triglycerides: 39 mg/dL (ref 0.0–149.0)
VLDL: 7.8 mg/dL (ref 0.0–40.0)

## 2024-04-08 LAB — CBC WITH DIFFERENTIAL/PLATELET
Basophils Absolute: 0 K/uL (ref 0.0–0.1)
Basophils Relative: 0.8 % (ref 0.0–3.0)
Eosinophils Absolute: 0.1 K/uL (ref 0.0–0.7)
Eosinophils Relative: 2.2 % (ref 0.0–5.0)
HCT: 41.6 % (ref 36.0–46.0)
Hemoglobin: 13.7 g/dL (ref 12.0–15.0)
Lymphocytes Relative: 38.2 % (ref 12.0–46.0)
Lymphs Abs: 1.5 K/uL (ref 0.7–4.0)
MCHC: 33 g/dL (ref 30.0–36.0)
MCV: 92.8 fl (ref 78.0–100.0)
Monocytes Absolute: 0.4 K/uL (ref 0.1–1.0)
Monocytes Relative: 9.3 % (ref 3.0–12.0)
Neutro Abs: 1.9 K/uL (ref 1.4–7.7)
Neutrophils Relative %: 49.5 % (ref 43.0–77.0)
Platelets: 253 K/uL (ref 150.0–400.0)
RBC: 4.48 Mil/uL (ref 3.87–5.11)
RDW: 13.4 % (ref 11.5–15.5)
WBC: 3.9 K/uL — ABNORMAL LOW (ref 4.0–10.5)

## 2024-04-08 LAB — VITAMIN B12: Vitamin B-12: 328 pg/mL (ref 211–911)

## 2024-04-08 LAB — TSH: TSH: 2.18 u[IU]/mL (ref 0.35–5.50)

## 2024-04-08 LAB — VITAMIN D 25 HYDROXY (VIT D DEFICIENCY, FRACTURES): VITD: 38.29 ng/mL (ref 30.00–100.00)

## 2024-04-08 LAB — HEMOGLOBIN A1C: Hgb A1c MFr Bld: 6 % (ref 4.6–6.5)

## 2024-04-08 NOTE — Progress Notes (Signed)
 Subjective:     Patient ID: Terri Parsons, female    DOB: [birth year], 53 y.o.   MRN: [id number]  Chief Complaint  Patient presents with   Nasal Congestion    Sinus pressure and drainage, thinks she has sinus infection.     HPI  Discussed the use of AI scribe software for clinical note transcription with the patient, who gave verbal consent to proceed.  History of Present Illness Terri Parsons is a 53 year old female who presents with symptoms of a possible sinus infection.  Upper respiratory symptoms - Persistent nasal congestion for approximately 10 days, most pronounced in the morning - Congestion improves with steam exposure - Homemade sinus rinse with boiled tap water used for symptom relief - No fever, chills, chest congestion, wheezing, coughing, shortness of breath, ear pain, or sore throat  Headache and neck pain - Intermittent headaches and neck pain present - Symptoms are improving  Olfactory and gustatory disturbances - Altered sense of smell with persistent burnt odor perceived in her apartment and at work - Altered sense of taste  Allergic rhinitis symptoms - History of runny nose and sneezing attributed to hay fever  Weight gain and metabolic concerns - Concern regarding weight gain and its potential impact on blood glucose levels in the context of prediabetes  Vitamin supplementation - Takes a multivitamin with B12 due to previously low B12 levels      Health Maintenance Due  Topic Date Due   Hepatitis B Vaccines 19-59 Average Risk (1 of 3 - 19+ 3-dose series) Never done   Cervical Cancer Screening (HPV/Pap Cotest)  Never done   Colonoscopy  Never done   Pneumococcal Vaccine: 50+ Years (1 of 1 - PCV) Never done   Zoster Vaccines- Shingrix (1 of 2) Never done   MAMMOGRAM  07/28/2023   Influenza Vaccine  02/29/2024    Past Medical History:  Diagnosis Date   Chronic RLQ pain 10/21/2019   Gait difficulty 10/21/2019    Neuropathic pain 10/21/2019   Pain of finger of right hand 08/29/2019   Prediabetes    Right groin pain 10/21/2019   Tinea pedis of left foot 08/14/2019   Vitamin D  deficiency     Past Surgical History:  Procedure Laterality Date   TONSILLECTOMY     TOTAL HIP ARTHROPLASTY Right 11/2021    Family History  Problem Relation Age of Onset   Renal Disease Mother    High blood pressure Mother    Early death Mother    Heart attack Father    Heart disease Father    Suicidality Sister    Suicidality Brother    Cancer Maternal Grandmother    Heart attack Paternal Grandmother    Heart disease Paternal Grandmother    Breast cancer Neg Hx    Colon cancer Neg Hx    Colon polyps Neg Hx    Esophageal cancer Neg Hx    Rectal cancer Neg Hx    Stomach cancer Neg Hx     Social History   Socioeconomic History   Marital status: Married    Spouse name: Not on file   Number of children: Not on file   Years of education: Not on file   Highest education level: Some college, no degree  Occupational History   Not on file  Tobacco Use   Smoking status: Former    Current packs/day: 0.50    Average packs/day: 0.5 packs/day for 20.0 years (10.0 ttl pk-yrs)    Types:  Cigarettes   Smokeless tobacco: Never  Substance and Sexual Activity   Alcohol use: Yes    Comment: weekend   Drug use: Never   Sexual activity: Not on file  Other Topics Concern   Not on file  Social History Narrative   Not on file   Social Drivers of Health   Financial Resource Strain: Medium Risk (04/04/2024)   Overall Financial Resource Strain (CARDIA)    Difficulty of Paying Living Expenses: Somewhat hard  Food Insecurity: Food Insecurity Present (04/04/2024)   Hunger Vital Sign    Worried About Running Out of Food in the Last Year: Sometimes true    Ran Out of Food in the Last Year: Sometimes true  Transportation Needs: No Transportation Needs (04/04/2024)   PRAPARE - Administrator, Civil Service  (Medical): No    Lack of Transportation (Non-Medical): No  Physical Activity: Inactive (04/04/2024)   Exercise Vital Sign    Days of Exercise per Week: 0 days    Minutes of Exercise per Session: Not on file  Stress: No Stress Concern Present (04/04/2024)   Terri Parsons [employer] - Occupational Stress Questionnaire    Feeling of Stress: Only a little  Social Connections: Moderately Isolated (04/04/2024)   Social Connection and Isolation Panel    Frequency of Communication with Friends and Family: More than three times a week    Frequency of Social Gatherings with Friends and Family: Once a week    Attends Religious Services: 1 to 4 times per year    Active Member of [employer] or Organizations: No    Attends Banker Meetings: Not on file    Marital Status: Separated  Intimate Partner Violence: Not on file (09/09/2019)    No outpatient medications prior to visit.   No facility-administered medications prior to visit.    Allergies  Allergen Reactions   Sulfa Antibiotics Rash    Review of Systems  Constitutional:  Negative for chills, fever and malaise/fatigue.  HENT:  Positive for congestion and sinus pain. Negative for ear pain, nosebleeds, sore throat and tinnitus.   Respiratory:  Negative for cough, sputum production and shortness of breath.   Cardiovascular:  Negative for chest pain, palpitations and leg swelling.  Gastrointestinal:  Negative for abdominal pain, constipation, diarrhea, nausea and vomiting.  Genitourinary:  Negative for dysuria, frequency and urgency.  Musculoskeletal:  Negative for myalgias.  Neurological:  Positive for headaches. Negative for dizziness and focal weakness.       Objective:    Physical Exam Constitutional:      General: She is not in acute distress.    Appearance: She is not ill-appearing.  HENT:     Right Ear: Tympanic membrane and ear canal normal.     Left Ear: Tympanic membrane and ear canal normal.     Nose:  Mucosal edema and congestion present. No rhinorrhea.     Left Nostril: No occlusion.     Right Turbinates: Enlarged.     Left Turbinates: Not enlarged.     Right Sinus: No maxillary sinus tenderness or frontal sinus tenderness.     Left Sinus: No maxillary sinus tenderness or frontal sinus tenderness.     Mouth/Throat:     Mouth: Mucous membranes are moist.     Pharynx: Oropharynx is clear. No oropharyngeal exudate or posterior oropharyngeal erythema.  Eyes:     Extraocular Movements: Extraocular movements intact.     Conjunctiva/sclera: Conjunctivae normal.  Cardiovascular:  Rate and Rhythm: Normal rate and regular rhythm.  Pulmonary:     Effort: Pulmonary effort is normal.     Breath sounds: Normal breath sounds.  Musculoskeletal:        General: Normal range of motion.     Cervical back: Normal range of motion and neck supple. No tenderness.     Right lower leg: No edema.     Left lower leg: No edema.  Skin:    General: Skin is warm and dry.  Neurological:     General: No focal deficit present.     Mental Status: She is alert and oriented to person, place, and time.     Motor: No weakness.     Coordination: Coordination normal.     Gait: Gait normal.  Psychiatric:        Mood and Affect: Mood normal.        Behavior: Behavior normal.        Thought Content: Thought content normal.      BP 102/74   Pulse 70   Temp 97.8 F (36.6 C) (Temporal)   Ht 5' 4 (1.626 m)   Wt 179 lb (81.2 kg)   SpO2 99%   BMI 30.73 kg/m  Wt Readings from Last 3 Encounters:  04/08/24 179 lb (81.2 kg)  03/21/23 171 lb (77.6 kg)  03/27/22 155 lb (70.3 kg)       Assessment & Plan:   Problem List Items Addressed This Visit     Hot flashes   Relevant Orders   CBC with Differential/Platelet (Completed)   Comprehensive metabolic panel with GFR (Completed)   TSH (Completed)   Vitamin D  deficiency   Relevant Orders   VITAMIN D  25 Hydroxy (Vit-D Deficiency, Fractures) (Completed)    Other Visit Diagnoses       Dysgeusia    -  Primary   Relevant Orders   Ambulatory referral to ENT     Prediabetes       Relevant Orders   Hemoglobin A1c (Completed)     Obesity (BMI 30.0-34.9)       Relevant Orders   CBC with Differential/Platelet (Completed)   Comprehensive metabolic panel with GFR (Completed)   Hemoglobin A1c (Completed)   Lipid panel (Completed)   TSH (Completed)     Vitamin B 12 deficiency       Relevant Orders   Vitamin B12 (Completed)     Chronic nasal congestion       Relevant Orders   Ambulatory referral to ENT       Assessment and Plan Assessment & Plan Altered sense of smell and taste with nasal congestion and sinus symptoms Symptoms began 12 days ago with nasal congestion, altered sense of smell (burnt odor), and intermittent headache. No fever, chills, or sore throat. Possible viral etiology, potentially COVID-19, given the altered sense of smell and taste. No need for COVID testing at this time due to the time course. Symptoms are improving, indicating a viral illness. No indication for antibiotics as symptoms are mild and improving. Referral to ENT is warranted due to persistent altered sense of smell and nasal congestion. - Refer to ENT for evaluation of persistent altered sense of smell and nasal congestion. - Recommend over-the-counter saline nasal rinse. - Suggest use of Flonase  (steroid nasal spray)   Right knee pain and stiffness Recent onset of right knee pain and stiffness following jogging.  Prediabetes Prediabetes with concern about weight gain and potential impact on blood sugar levels. Previous  A1c indicated prediabetes. No recent labs available. - Order blood work including A1c and cholesterol levels.  General Health Maintenance Discussion about scheduling a physical exam and conducting lab tests due to concerns about weight gain and prediabetes. - Schedule physical exam. - Order blood work including A1c and cholesterol  levels.     Meela Barros-Taylor does not currently have medications on file.  No orders of the defined types were placed in this encounter.

## 2024-04-08 NOTE — Patient Instructions (Addendum)
 Please go downstairs for labs before you leave  Try using over-the-counter Flonase   I referred you to ENT and they will call you to schedule a visit.  I will be in touch with your results

## 2024-04-10 ENCOUNTER — Ambulatory Visit: Payer: Self-pay | Admitting: Family Medicine

## 2024-04-23 DIAGNOSIS — Z1389 Encounter for screening for other disorder: Secondary | ICD-10-CM | POA: Diagnosis not present

## 2024-04-23 DIAGNOSIS — Z130 Encounter for screening for diseases of the blood and blood-forming organs and certain disorders involving the immune mechanism: Secondary | ICD-10-CM | POA: Diagnosis not present

## 2024-04-23 DIAGNOSIS — N951 Menopausal and female climacteric states: Secondary | ICD-10-CM | POA: Diagnosis not present

## 2024-04-23 DIAGNOSIS — Z1211 Encounter for screening for malignant neoplasm of colon: Secondary | ICD-10-CM | POA: Diagnosis not present

## 2024-04-23 DIAGNOSIS — Z01411 Encounter for gynecological examination (general) (routine) with abnormal findings: Secondary | ICD-10-CM | POA: Diagnosis not present

## 2024-04-23 DIAGNOSIS — Z113 Encounter for screening for infections with a predominantly sexual mode of transmission: Secondary | ICD-10-CM | POA: Diagnosis not present

## 2024-04-23 DIAGNOSIS — Z1231 Encounter for screening mammogram for malignant neoplasm of breast: Secondary | ICD-10-CM | POA: Diagnosis not present

## 2024-05-21 LAB — COLOGUARD: COLOGUARD: NEGATIVE

## 2024-05-22 ENCOUNTER — Other Ambulatory Visit: Payer: Self-pay | Admitting: Medical Genetics

## 2024-05-22 DIAGNOSIS — Z006 Encounter for examination for normal comparison and control in clinical research program: Secondary | ICD-10-CM

## 2024-05-29 ENCOUNTER — Ambulatory Visit (INDEPENDENT_AMBULATORY_CARE_PROVIDER_SITE_OTHER): Admitting: Otolaryngology

## 2024-05-29 ENCOUNTER — Encounter (INDEPENDENT_AMBULATORY_CARE_PROVIDER_SITE_OTHER): Payer: Self-pay | Admitting: Otolaryngology

## 2024-05-29 VITALS — BP 112/78 | HR 76 | Ht 64.0 in | Wt 180.0 lb

## 2024-05-29 DIAGNOSIS — J342 Deviated nasal septum: Secondary | ICD-10-CM

## 2024-05-29 DIAGNOSIS — J343 Hypertrophy of nasal turbinates: Secondary | ICD-10-CM

## 2024-05-29 DIAGNOSIS — J310 Chronic rhinitis: Secondary | ICD-10-CM

## 2024-05-29 DIAGNOSIS — J300 Vasomotor rhinitis: Secondary | ICD-10-CM

## 2024-05-29 DIAGNOSIS — Z87891 Personal history of nicotine dependence: Secondary | ICD-10-CM

## 2024-05-29 DIAGNOSIS — R442 Other hallucinations: Secondary | ICD-10-CM | POA: Diagnosis not present

## 2024-05-29 MED ORDER — FLUTICASONE PROPIONATE 50 MCG/ACT NA SUSP
2.0000 | Freq: Two times a day (BID) | NASAL | 6 refills | Status: AC
Start: 2024-05-29 — End: 2024-06-28

## 2024-05-29 MED ORDER — PREDNISONE 20 MG PO TABS: 20.0000 mg | ORAL_TABLET | Freq: Every day | ORAL | 0 refills | Status: AC

## 2024-05-29 MED ORDER — IPRATROPIUM BROMIDE 0.06 % NA SOLN: 2.0000 | Freq: Two times a day (BID) | NASAL | 12 refills | Status: AC | PRN

## 2024-05-29 NOTE — Progress Notes (Signed)
 Dear Dr. Lendia, Here is my assessment for our mutual patient, Terri Parsons. Thank you for allowing me the opportunity to care for your patient. Please do not hesitate to contact me should you have any other questions. Sincerely, Dr. Eldora Blanch  Otolaryngology Clinic Note  HISTORY:  Initial visit (04/2024): Discussed the use of AI scribe software for clinical note transcription with the patient, who gave verbal consent to proceed.  History of Present Illness Terri Parsons is a 53 year old female who presents with phantom smoke smells and nasal congestion and dripping.  For two months, she experiences intermittent phantom smoke smell which she thinks is from her right nostril, not perceived by others. She denies antecedent URI, no COVID testing. Feels like maybe things some better. Does have right congestion, and mild max discomfort intermittently, but no consistent discolored drainage or facial pressure/pain. Does not have h/o frequent sinus infections.  She has postnasal drip and frequent rhinorrhea triggered by temperature changes, food, and fragrances. She uses pre-made saline packages two to three times in the morning after discontinuing a homemade saline rinse (like AVS recipe). No nasal sprays, antibiotics, or steroids have been used.  She was previously told she might have sensitivities to mold, dust, dog hair, and certain grasses. She recently started hormone replacement therapy (2 weeks ago).  No nasal trauma.  Denies typical AR symptoms currently. Allergy testing has been done - mold, dust, dog hair, grass (many years). No previous sinonasal surgery.  Denies neurologic sx or weakness or tremors. Eats fairly well  GLP-1: no AP/AC: no  Tobacco: former, quit ENT Surgery: tonsillectomy  PMHx: PreDM  RADIOGRAPHIC EVALUATION AND INDEPENDENT REVIEW OF OTHER RECORDS:: Terri Parsons (04/08/2024): possible sinus infection, congestion; using rinses; altered  sense of smell with burnt odor and altered taste; h/o AR; Dx: congestion, altered sense of smell and headache; possible COVID; improving; ref to ENT, suggest flonase  CBC and CMP 04/08/2024: WBC 3.9, Eos 100; BUN/Cr 12/0.71  Past Medical History:  Diagnosis Date   Chronic RLQ pain 10/21/2019   Gait difficulty 10/21/2019   Neuropathic pain 10/21/2019   Pain of finger of right hand 08/29/2019   Prediabetes    Right groin pain 10/21/2019   Tinea pedis of left foot 08/14/2019   Vitamin D  deficiency    Past Surgical History:  Procedure Laterality Date   TONSILLECTOMY     TOTAL HIP ARTHROPLASTY Right 11/2021   Family History  Problem Relation Age of Onset   Renal Disease Mother    High blood pressure Mother    Early death Mother    Heart attack Father    Heart disease Father    Suicidality Sister    Suicidality Brother    Cancer Maternal Grandmother    Heart attack Paternal Grandmother    Heart disease Paternal Grandmother    Breast cancer Neg Hx    Colon cancer Neg Hx    Colon polyps Neg Hx    Esophageal cancer Neg Hx    Rectal cancer Neg Hx    Stomach cancer Neg Hx    Social History   Tobacco Use   Smoking status: Former    Current packs/day: 0.50    Average packs/day: 0.5 packs/day for 20.0 years (10.0 ttl pk-yrs)    Types: Cigarettes   Smokeless tobacco: Never  Substance Use Topics   Alcohol use: Yes    Comment: weekend   Allergies  Allergen Reactions   Sulfa Antibiotics Rash   Current Outpatient Medications  Medication  Sig Dispense Refill   BIJUVA 1-100 MG CAPS Take 1 capsule every day by oral route for 90 days.     fluticasone  (FLONASE ) 50 MCG/ACT nasal spray Place 2 sprays into both nostrils in the morning and at bedtime. 16 g 6   ipratropium (ATROVENT) 0.06 % nasal spray Place 2 sprays into both nostrils 2 (two) times daily as needed. 15 mL 12   predniSONE (DELTASONE) 20 MG tablet Take 1 tablet (20 mg total) by mouth daily with breakfast for 7 days. 7  tablet 0   No current facility-administered medications for this visit.   BP 112/78 (BP Location: Left Arm, Patient Position: Sitting, Cuff Size: Large)   Pulse 76   Ht 5' 4 (1.626 m)   Wt 180 lb (81.6 kg)   SpO2 96%   BMI 30.90 kg/m   PHYSICAL EXAM:  BP 112/78 (BP Location: Left Arm, Patient Position: Sitting, Cuff Size: Large)   Pulse 76   Ht 5' 4 (1.626 m)   Wt 180 lb (81.6 kg)   SpO2 96%   BMI 30.90 kg/m    Salient findings:  CN II-XII intact Bilateral EAC clear and TM intact with well pneumatized middle ear spaces Nose: Anterior rhinoscopy reveals septal deviation right, bilateral inferior turbinate hypertrophy.  Nasal endoscopy was indicated to better evaluate the nose and paranasal sinuses, given the patient's history and exam findings, and is detailed below. No lesions of oral cavity/oropharynx No obviously palpable neck masses/lymphadenopathy/thyromegaly No respiratory distress or stridor   PROCEDURE:  Prior to initiating any procedures, risks/benefits/alternatives were explained to the patient and verbal consent obtained. Diagnostic Nasal Endoscopy Pre-procedure diagnosis: Phantosmia Post-procedure diagnosis: same Indication: See pre-procedure diagnosis and physical exam above Complications: None apparent EBL: 0 mL Anesthesia: Lidocaine 4% and topical decongestant was topically sprayed in each nasal cavity  Description of Procedure:  Patient was identified. A rigid 30 degree endoscope was utilized to evaluate the sinonasal cavities, mucosa, sinus ostia and turbinates and septum.  Overall, signs of mucosal inflammation are not noted.  Also noted are some difficulty visualizing entire olfactory cleft due to septal deviation but no large masses noted either side.  No mucopurulence, polyps, or masses noted.   Right Middle meatus: clear Right SE Recess: clear Left MM: clear Left SE Recess: clear  Photodocumentation was obtained.  CPT CODE -- 31231 - Mod  25   ASSESSMENT:  53 y.o.   1. Phantosmia   2. Vasomotor rhinitis   3. Chronic rhinitis   4. 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 - Endo overall reassuring, and maybe getting some better. As such, after discussion of options, will do medical management and see if improves. If not, consider MRI - Also some component of vasomotor and irritant rhinitis -- triggered by smell/perfume/temp/foods. Will medically manage this as well  - Prescribe Flonase , four sprays daily, added to nasal rinse. - Prescribe Atrovent nasal spray as needed BID for nasal dripping. - Prescribe short course of prednisone. Discussed RBA - Daily Sinus Rinse - Advise smell retraining multiple times per day for 6-8 weeks. - Schedule follow-up in three months to reassess symptoms and consider MRI if no improvement.  See below regarding exact medications prescribed this encounter including dosages and route: Meds ordered this encounter  Medications   ipratropium (ATROVENT) 0.06 % nasal spray  Sig: Place 2 sprays into both nostrils 2 (two) times daily as needed.    Dispense:  15 mL    Refill:  12   fluticasone  (FLONASE ) 50 MCG/ACT nasal spray    Sig: Place 2 sprays into both nostrils in the morning and at bedtime.    Dispense:  16 g    Refill:  6   predniSONE (DELTASONE) 20 MG tablet    Sig: Take 1 tablet (20 mg total) by mouth daily with breakfast for 7 days.    Dispense:  7 tablet    Refill:  0     Thank you for allowing me the opportunity to care for your patient. Please do not hesitate to contact me should you have any other questions.  Sincerely, Eldora Blanch, MD Otolaryngologist (ENT), [facility] Phone: [phone] Fax: [phone]  MDM:  Level 4:  [phone] Complexity/Problems addressed: mod - chronic problems, multiple Data complexity: mod - independent review of note, labs - Morbidity: mod  - Prescription Drug prescribed or managed: y  05/29/2024, 8:29 AM

## 2024-05-29 NOTE — Patient Instructions (Addendum)
  Use smell retraining kit twice a day for 6-8 weeks  Prednisone take 20mg  tablet every dya with breakfast for 7 days.   Flonase  --- put 4 sprays in the rinse and rinse daily For the dripping, can use 2 sprays of atrovent each nostril as needed.   Aurel[facility] Med Nasal Saline Rinse  - start nasal saline rinses with NeilMed Bottle available over the counter    Nasal Saline Irrigation instructions: If you choose to make your own salt water solution, You will need: Salt (kosher, canning, or pickling salt) Baking soda Nasal irrigation bottle (i.e. Aureliano Med Sinus Rinse) Measuring spoon ( teaspoon) Distilled / boiled water   Mix solution Mix 1 teaspoon of salt, 1/2 teaspoon of baking soda and 1 cup of water into irrigation bottle ** May use saline packet instead of homemade recipe for this step if you prefer If medicine was prescribed to be mixed with solution, place this into bottle Examples 2 inches of 2% mupirocin ointment Budesonide solution Position your head: Lean over sink (about 45 degrees) Rotate head (about 45 degrees) so that one nostril is above the other Irrigate Insert tip of irrigation bottle into upper nostril so it forms a comfortable seal Irrigate while breathing through your mouth May remove the straw from the bottle in order to irrigate the entire solution (important if medicine was added) Exhale through nose when finished and blow nose as necessary  Repeat on opposite side with other 1/2 of solution (120 mL) or remake solution if all 240 mL was used on first side Wash irrigation bottle regularly, replace every 3 months

## 2024-07-05 LAB — GENECONNECT MOLECULAR SCREEN: Genetic Analysis Overall Interpretation: NEGATIVE

## 2024-08-27 ENCOUNTER — Ambulatory Visit (INDEPENDENT_AMBULATORY_CARE_PROVIDER_SITE_OTHER): Admitting: Otolaryngology
